# Patient Record
Sex: Male | Born: 1985 | Hispanic: Yes | Marital: Married | Smoking: Never smoker
Health system: Southern US, Community
[De-identification: ages and names within clinical notes are randomized; demographics above are authoritative.]

## PROBLEM LIST (undated history)

## (undated) DIAGNOSIS — I10 Essential (primary) hypertension: Secondary | ICD-10-CM

## (undated) DIAGNOSIS — K219 Gastro-esophageal reflux disease without esophagitis: Secondary | ICD-10-CM

## (undated) HISTORY — DX: Gastro-esophageal reflux disease without esophagitis: K21.9

## (undated) HISTORY — DX: Essential (primary) hypertension: I10

---

## 2009-07-24 ENCOUNTER — Emergency Department (HOSPITAL_COMMUNITY): Admission: EM | Admit: 2009-07-24 | Discharge: 2009-07-24 | Payer: Self-pay | Admitting: Emergency Medicine

## 2009-09-29 ENCOUNTER — Ambulatory Visit: Payer: Self-pay | Admitting: Urgent Care

## 2009-09-29 DIAGNOSIS — K921 Melena: Secondary | ICD-10-CM

## 2009-09-29 DIAGNOSIS — R109 Unspecified abdominal pain: Secondary | ICD-10-CM

## 2009-09-29 DIAGNOSIS — K219 Gastro-esophageal reflux disease without esophagitis: Secondary | ICD-10-CM | POA: Insufficient documentation

## 2009-10-04 ENCOUNTER — Encounter: Payer: Self-pay | Admitting: Gastroenterology

## 2010-10-11 NOTE — Letter (Signed)
Summary: TCS/EGD ORDER  TCS/EGD ORDER   Imported By: Ave Filter 10/04/2009 12:51:57  _____________________________________________________________________  External Attachment:    Type:   Image     Comment:   External Document

## 2010-10-11 NOTE — Assessment & Plan Note (Signed)
Summary: abd pain,ss   Visit Type:  Initial Consult Referring Provider:  Laverle Hobby Primary Care Provider:  Laverle Hobby  Chief Complaint:  abd pain/ blood in stool.  History of Present Illness: 25 y/o hispanic male w/ "muscle strain" lower abd Oct 2010.  Was seen in ER and found to have hematuria.  Saw urologist for hematuria, next urine clear, no work-up needed.  Started robaxin for muscle strain, was checked by Dr Wende Crease and c/o in blood in stool.  Pt c/o pressure anus x 2 months.  Noticed red blood in small amts in toilet & on paper.  c/o constipation with BM  once daily.  Normally 3 formed BMs daily.  Denies N/V/  c/o RUQ pain, worse post-prandially.  On prilosec x 6 wks, helps some.  Denies dysphagia/odynophagia.  Denies wt loss.  Denies fever/chills. c/o bloating w/ nervousness.  Was taking IBU 200mg  daily x 2weeks.  Last workout 3 months ago.  CT A/P (urogram) non-contrast except bulging disk protrusion L5-S1.  Current Problems (verified): 1)  Abdominal Pain  (ICD-789.00) 2)  Hematochezia  (ICD-578.1) 3)  Gerd  (ICD-530.81)  Current Medications (verified): 1)  Prilosec .... Take 1 Tablet By Mouth Once A Day 2)  Tylenol .... As Needed  Allergies (verified): No Known Drug Allergies  Past History:  Past Medical History: GERD  Past Surgical History: Unremarkable  Family History: No known family history of colorectal carcinoma, IBD, liver or chronic GI problems. 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  Alcohol Use - yes, couple time per month Daily Caffeine Use Illicit Drug Use - no Patient gets regular exercise. Smoking Status:  never Drug Use:  no Does Patient Exercise:  yes  Review of Systems      See HPI General:  Denies fever, chills, sweats, anorexia, fatigue, weakness, malaise, weight loss, and sleep disorder. CV:  Denies chest  pains, angina, palpitations, syncope, dyspnea on exertion, orthopnea, PND, peripheral edema, and claudication. Resp:  Denies dyspnea at rest, dyspnea with exercise, cough, sputum, wheezing, coughing up blood, and pleurisy. GU:  Denies urinary burning, blood in urine, urinary frequency, urinary hesitancy, nocturnal urination, and urinary incontinence. Derm:  Denies rash, itching, dry skin, hives, moles, warts, and unhealing ulcers. Psych:  Denies depression, anxiety, memory loss, suicidal ideation, hallucinations, paranoia, phobia, and confusion. Heme:  Denies bruising, bleeding, and enlarged lymph nodes.  Vital Signs:  Patient profile:   25 year old male Height:      66 inches Weight:      203 pounds BMI:     32.88 Temp:     98.3 degrees F oral Pulse rate:   80 / minute BP sitting:   142 / 82  (left arm) Cuff size:   regular  Vitals Entered By: Cloria Spring LPN (September 29, 2009 11:18 AM)  Physical Exam  General:  Well developed, well nourished, no acute distress. Head:  Normocephalic and atraumatic. Eyes:  Sclera clear, no icterus. Ears:  Normal auditory acuity. Nose:  No deformity, discharge,  or lesions. Mouth:  No deformity or lesions, dentition normal. Neck:  Supple; no masses or thyromegaly. Lungs:  Clear throughout to auscultation. Heart:  Regular rate and rhythm; no murmurs, rubs,  or bruits. Abdomen:  Soft, nontender and nondistended. No masses, hepatosplenomegaly or hernias noted. Normal bowel sounds.without guarding and without rebound.   Msk:  Symmetrical with no gross deformities. Normal  posture. Pulses:  Normal pulses noted. Extremities:  No clubbing, cyanosis, edema or deformities noted. Neurologic:  Alert and  oriented x4;  grossly normal neurologically. Skin:  Intact without significant lesions or rashes. Cervical Nodes:  No significant cervical adenopathy. Psych:  Alert and cooperative. Normal mood and affect.  Impression & Recommendations:  Problem # 1:   ABDOMINAL PAIN (ICD-789.00) 25 y/o hispanic male w/ 3 month hx of abd pain that started in lower abd and presented like "muscle strain" and has now migrated to RUQ. Not resolved by rest and avoiding exercise.  He  has typical GERD symptoms not controlled by PPI, as well as hematochezia.  Differentials include PUD, IBS with benign ano-rectal bleeding, gallbladder disease, less likely IBD at this point.  EGD to be performed by Dr. Jonette Eva in the near future.  I have discussed risks and benefits which include, but are not limited to, bleeding, infection, perforation, or medication reaction.  The patient agrees with this plan and consent will be obtained.  Orders: Consultation Level III (16109)  Problem # 2:  HEMATOCHEZIA (ICD-578.1) See#1  Problem # 3:  GERD (ICD-530.81) See #1     Appended Document: abd pain,ss Pt needs TCS for rectal bleeding as well.  Appended Document: abd pain,ss TCS scheduled as well.  Risks/benefits explained at OV.  Pt agreed with plan.  Appended Document: abd pain,ss PT NS for procedures

## 2010-12-14 LAB — URINALYSIS, ROUTINE W REFLEX MICROSCOPIC
Bilirubin Urine: NEGATIVE
Glucose, UA: NEGATIVE mg/dL
Ketones, ur: NEGATIVE mg/dL
Leukocytes, UA: NEGATIVE
Nitrite: NEGATIVE
Protein, ur: NEGATIVE mg/dL
Specific Gravity, Urine: 1.025 (ref 1.005–1.030)
Urobilinogen, UA: 0.2 mg/dL (ref 0.0–1.0)
pH: 6.5 (ref 5.0–8.0)

## 2010-12-14 LAB — URINE MICROSCOPIC-ADD ON

## 2011-01-25 IMAGING — CT CT ABDOMEN W/O CM
2 of 4 series · 16 of 46 positions shown, 18 images · non-contrast
Comparison: None.

CT ABDOMEN

CLINICAL DATA: Left-sided abdomen pain and dizziness.

CT ABDOMEN AND PELVIS WITHOUT CONTRAST (CT UROGRAM)
TECHNIQUE: Multidetector CT imaging was performed through the
abdomen and pelvis to include the urinary tract.

[Series 2: standard/full over (age)lbs 5.0 · axial · 0.64mm/px · z∈[+502,+942]mm · 13 of 96 slices shown, 15 images]
[im 4/96  soft-tissue]
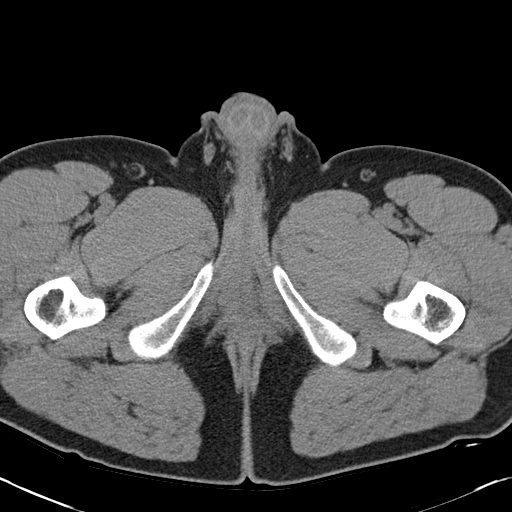
[im 4/96  bone]
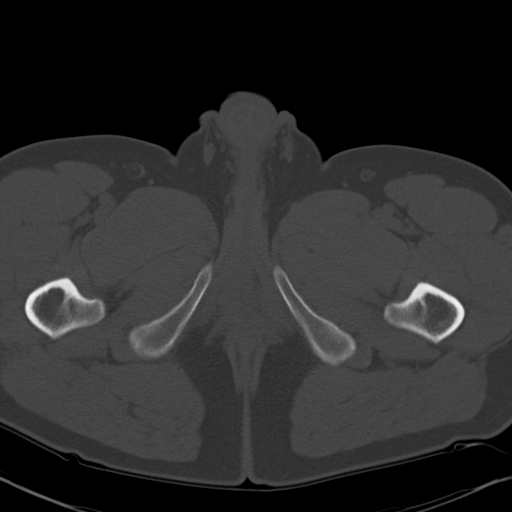
[im 12/96  soft-tissue]
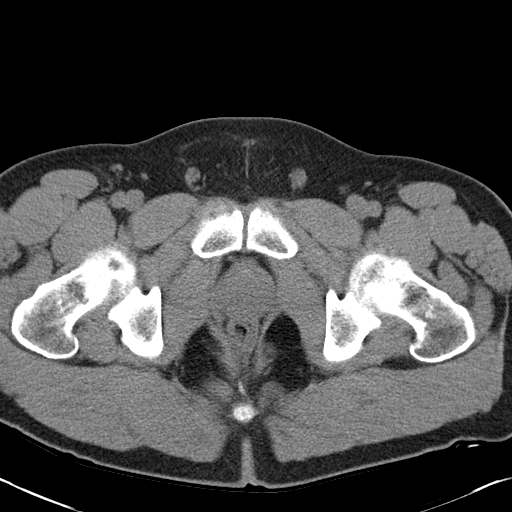
[im 20/96  soft-tissue]
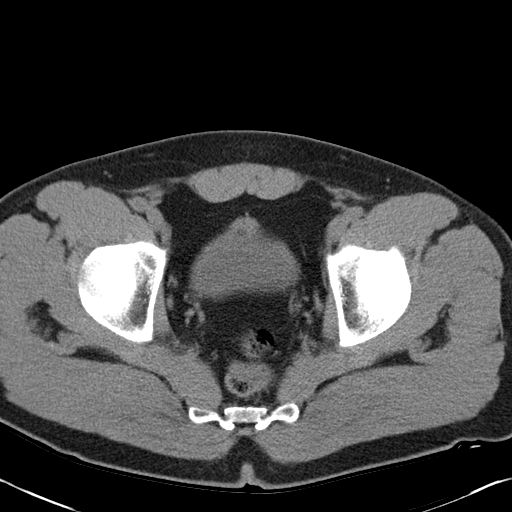
[im 27/96  soft-tissue]
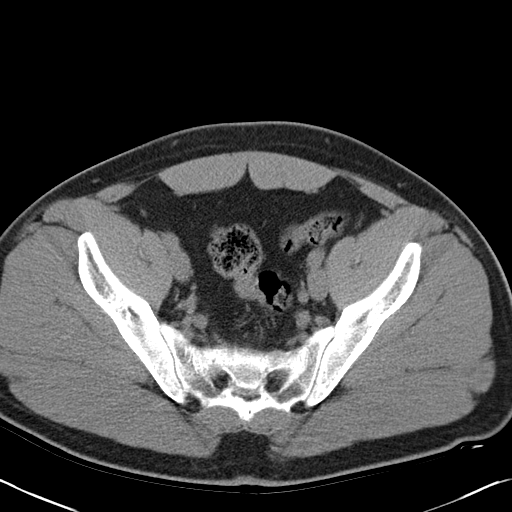
[im 35/96  soft-tissue]
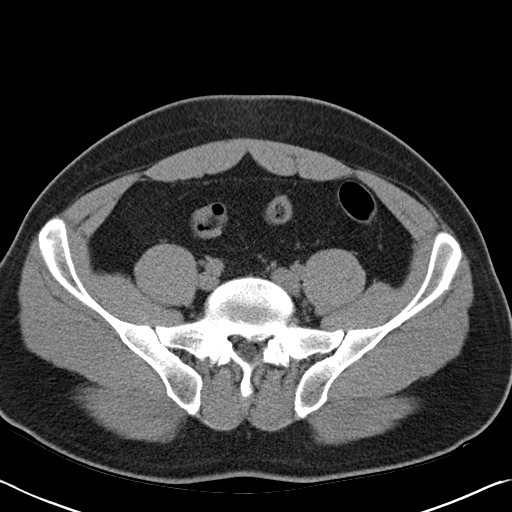
[im 42/96  soft-tissue]
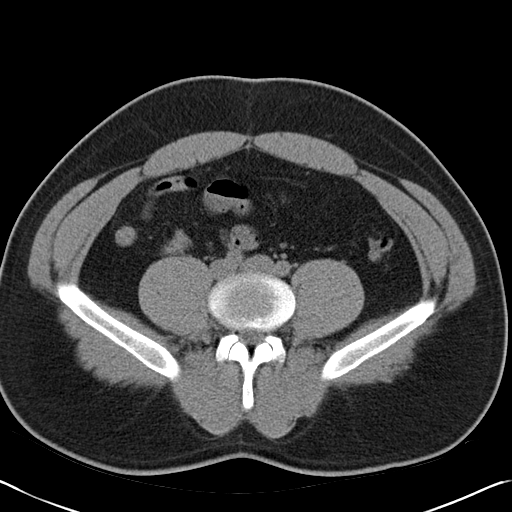
[im 50/96  soft-tissue]
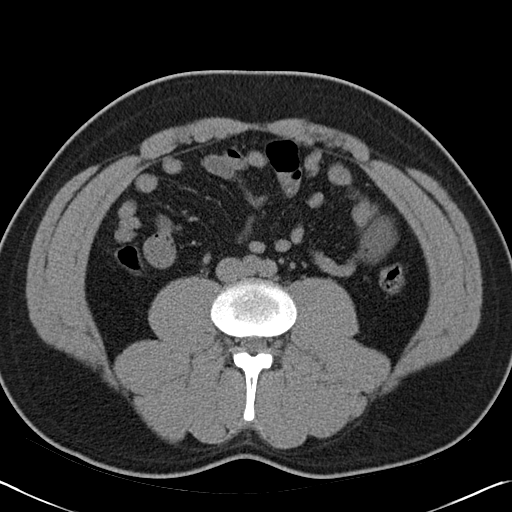
[im 54/96  soft-tissue]
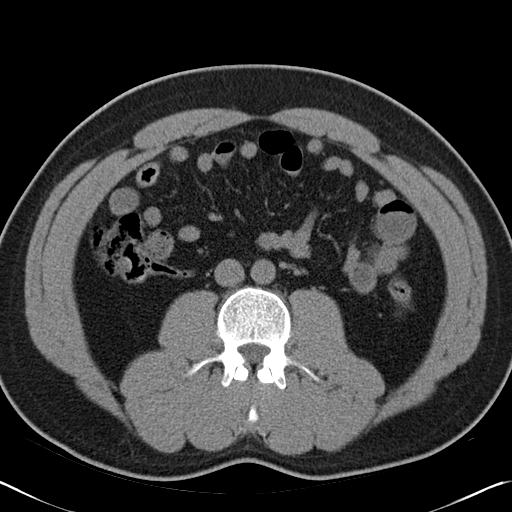
[im 61/96  soft-tissue]
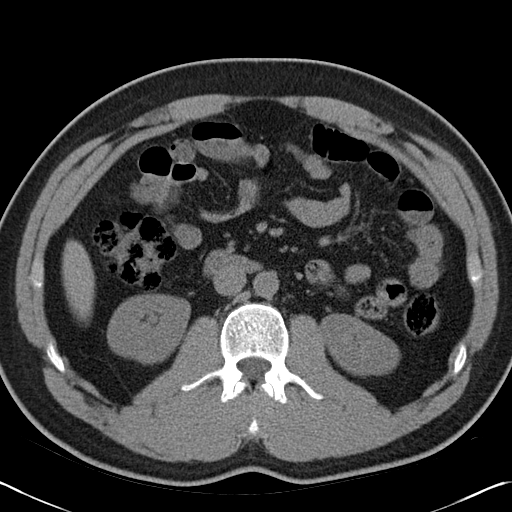
[im 61/96  bone]
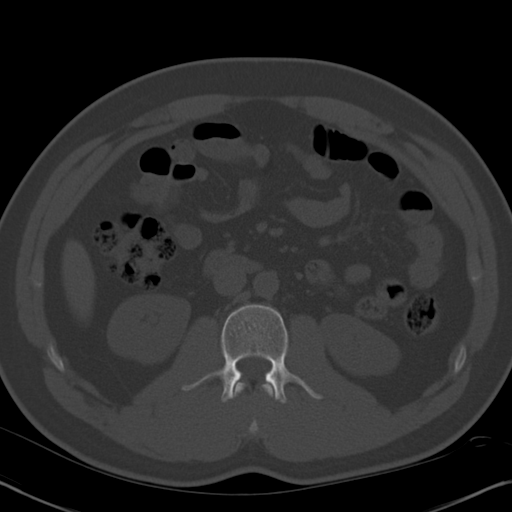
[im 69/96  soft-tissue]
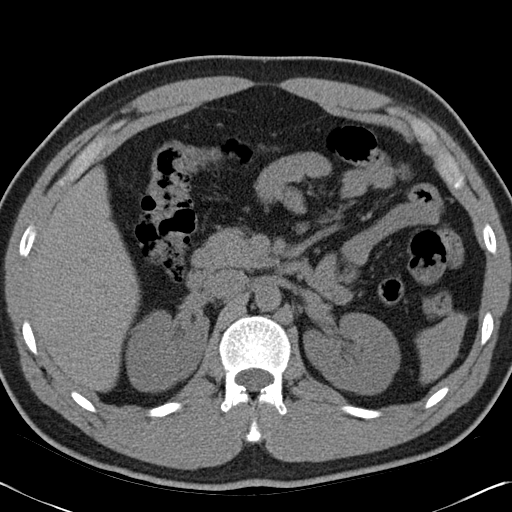
[im 77/96  soft-tissue]
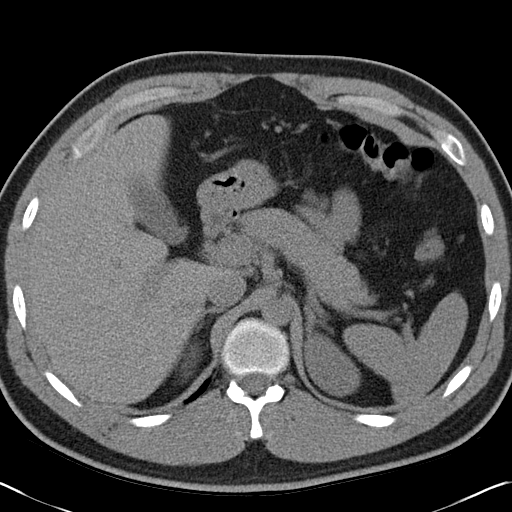
[im 84/96  soft-tissue]
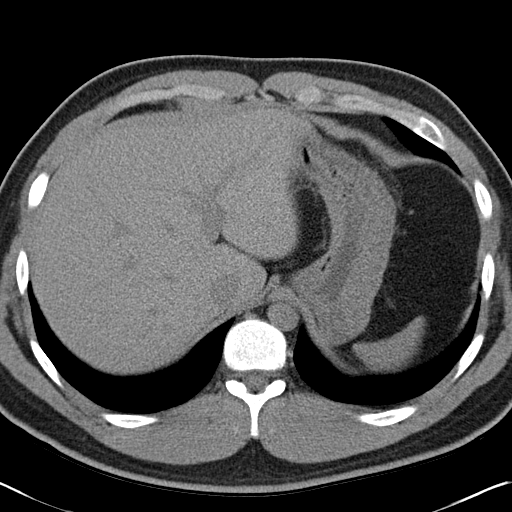
[im 92/96  soft-tissue]
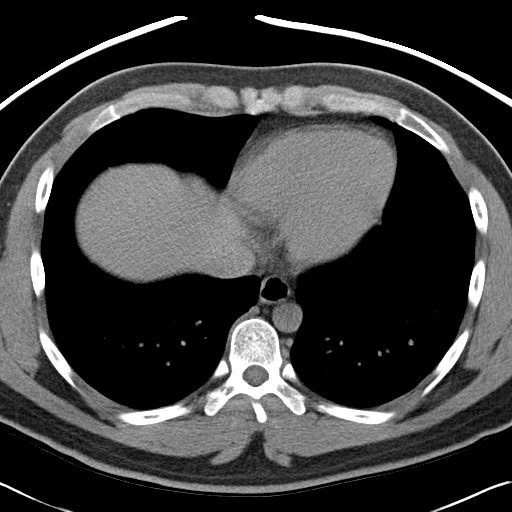

[Series 4: mpr coronal · coronal · 0.68mm/px · 3 of 82 slices shown]
[im 28/82  soft-tissue]
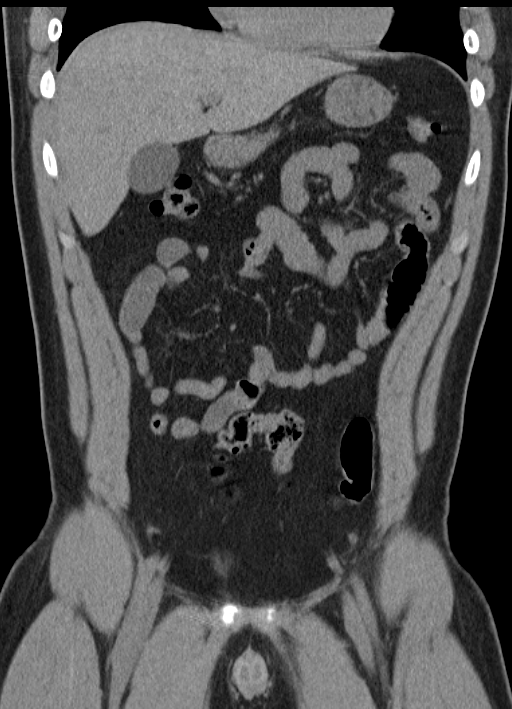
[im 37/82  soft-tissue]
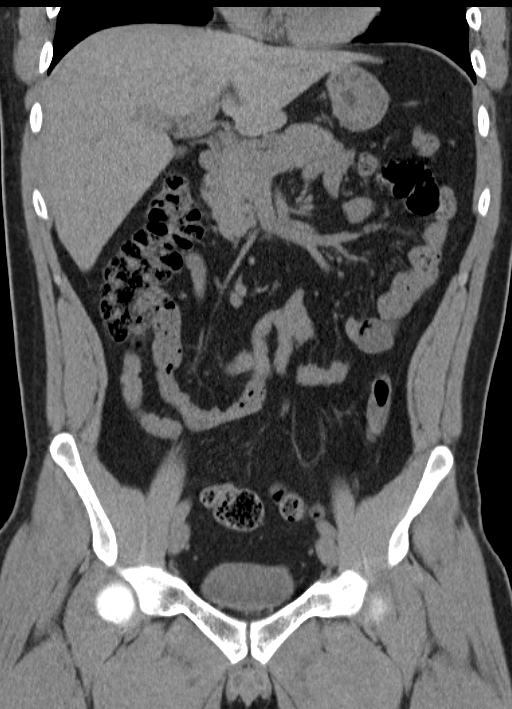
[im 46/82  soft-tissue]
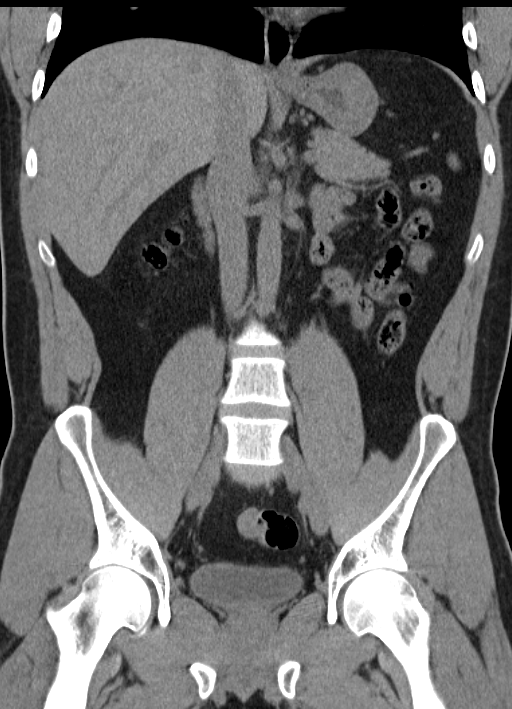

[16 of 46 positions shown; findings below may reference images not displayed]

FINDINGS: No intrarenal or proximal ureteral calculi on either
side. No evidence of hydronephrosis or other secondary signs of
upper urinary tract obstruction. Within limits of unenhanced
technique, normal appearing kidneys.

Again within limits of unenhanced technique, remaining visualized
upper abdomen unremarkable. Visualized extreme lung bases clear.
IMPRESSION: Normal unenhanced CT of the abdomen.

CT PELVIS
FINDINGS: No distal ureteral calculi on either side. Appendix
identified and normal. Prostate gland and seminal vesicles normal
for age. Visualized colon and small bowel unremarkable. No free
fluid.

Incidental small disc protrusion L5-S1.
IMPRESSION: Normal unenhanced CT pelvis.

## 2018-01-10 DIAGNOSIS — S61039A Puncture wound without foreign body of unspecified thumb without damage to nail, initial encounter: Secondary | ICD-10-CM | POA: Insufficient documentation

## 2021-06-19 ENCOUNTER — Ambulatory Visit (INDEPENDENT_AMBULATORY_CARE_PROVIDER_SITE_OTHER): Payer: Self-pay

## 2021-06-19 ENCOUNTER — Ambulatory Visit: Payer: Self-pay

## 2021-06-19 ENCOUNTER — Ambulatory Visit
Admission: EM | Admit: 2021-06-19 | Discharge: 2021-06-19 | Disposition: A | Discharge status: Home / Self Care | Payer: Self-pay | Attending: Family Medicine | Admitting: Family Medicine

## 2021-06-19 ENCOUNTER — Other Ambulatory Visit: Payer: Self-pay

## 2021-06-19 ENCOUNTER — Encounter: Payer: Self-pay | Admitting: Emergency Medicine

## 2021-06-19 ENCOUNTER — Ambulatory Visit: Admission: EM | Admit: 2021-06-19 | Discharge status: Absent without leave | Payer: Self-pay

## 2021-06-19 DIAGNOSIS — M25572 Pain in left ankle and joints of left foot: Secondary | ICD-10-CM

## 2021-06-19 DIAGNOSIS — Y9364 Activity, baseball: Secondary | ICD-10-CM

## 2021-06-19 MED ORDER — NAPROXEN 500 MG PO TABS: 500.0000 mg | ORAL_TABLET | Freq: Two times a day (BID) | ORAL | 0 refills | Status: DC | PRN

## 2021-06-19 NOTE — ED Triage Notes (Signed)
Slid into base and hurt left ankle today.

## 2021-06-19 NOTE — ED Provider Notes (Signed)
[facility]    CSN: [id number] Arrival date & time: 06/19/21  1513      History   Chief Complaint Chief Complaint  Patient presents with   Ankle Pain    left    HPI Matthew Mitchell is a 35 y.o. male.   Patient presenting today with left anterior and lateral ankle pain, decreased range of motion after sliding into first base during a baseball game today.  The area is swollen but not discolored and no numbness, tingling, weakness, skin injury.  Not trying anything over-the-counter thus far, came straight here after it happened.  No past history of orthopedic injury to the ankle.   History reviewed. No pertinent past medical history.  Patient Active Problem List   Diagnosis Date Noted   GERD 09/29/2009   HEMATOCHEZIA 09/29/2009   ABDOMINAL PAIN 09/29/2009    History reviewed. No pertinent surgical history.     Home Medications    Prior to Admission medications   Medication Sig Start Date End Date Taking? Authorizing Provider  naproxen (NAPROSYN) 500 MG tablet Take 1 tablet (500 mg total) by mouth 2 (two) times daily as needed. 06/19/21  Yes Particia Nearing, PA-C    Family History No family history on file.  Social History Social History   Tobacco Use   Smoking status: Never   Smokeless tobacco: Never  Vaping Use   Vaping Use: Never used  Substance Use Topics   Alcohol use: Yes    Alcohol/week: 1.0 standard drink    Types: 1 Standard drinks or equivalent per week     Allergies   Patient has no known allergies.   Review of Systems Review of Systems Per HPI  Physical Exam Triage Vital Signs ED Triage Vitals [06/19/21 1547]  Enc Vitals Group     BP (!) 129/96     Pulse Rate (!) 101     Resp 18     Temp 98.8 F (37.1 C)     Temp Source Oral     SpO2 99 %     Weight 180 lb (81.6 kg)     Height      Head Circumference      Peak Flow      Pain Score 9     Pain Loc      Pain Edu?      Excl. in GC?    No data  found.  Updated Vital Signs BP (!) 129/96 (BP Location: Right Arm)   Pulse (!) 101   Temp 98.8 F (37.1 C) (Oral)   Resp 18   Wt 180 lb (81.6 kg)   SpO2 99%   Visual Acuity Right Eye Distance:   Left Eye Distance:   Bilateral Distance:    Right Eye Near:   Left Eye Near:    Bilateral Near:     Physical Exam Vitals and nursing note reviewed.  Constitutional:      Appearance: Normal appearance.  HENT:     Head: Atraumatic.     Mouth/Throat:     Mouth: Mucous membranes are moist.  Eyes:     Extraocular Movements: Extraocular movements intact.     Conjunctiva/sclera: Conjunctivae normal.  Cardiovascular:     Rate and Rhythm: Normal rate and regular rhythm.  Pulmonary:     Effort: Pulmonary effort is normal.     Breath sounds: Normal breath sounds.  Musculoskeletal:     Cervical back: Normal range of motion and neck supple.  Comments: Currently in wheelchair due to ankle pain, ambulatory at baseline.  Left ankle range of motion exam limited by significant pain but appears to have at least some motion in all directions.  Anterior and lateral left ankle swelling, significant tenderness to palpation particularly anteriorly.  No laxity on exam  Skin:    General: Skin is warm and dry.     Findings: No erythema, lesion or rash.  Neurological:     General: No focal deficit present.     Mental Status: He is oriented to person, place, and time.     Comments: Left lower extremity neurovascularly intact  Psychiatric:        Mood and Affect: Mood normal.        Thought Content: Thought content normal.        Judgment: Judgment normal.     UC Treatments / Results  Labs (all labs ordered are listed, but only abnormal results are displayed) Labs Reviewed - No data to display  EKG   Radiology DG Ankle Complete Left  Result Date: 06/19/2021 CLINICAL DATA:  Baseball injury, left ankle pain EXAM: LEFT ANKLE COMPLETE - 3+ VIEW COMPARISON:  None. FINDINGS: Frontal, oblique,  and lateral views of the left ankle are obtained. No evidence of acute fracture. There is slight widening of the ankle mortise posteriorly and laterally, which may reflect underlying ligamentous injury or laxity. No dislocation. Prominent anterior and lateral soft tissue swelling. IMPRESSION: 1. No acute displaced fracture. 2. Slight widening of the ankle mortise laterally and posteriorly which may reflect ligamentous injury or laxity. 3. Anterolateral soft tissue swelling. Electronically Signed   By: Sharlet Salina M.D.   On: 06/19/2021 15:29    Procedures Procedures (including critical care time)  Medications Ordered in UC Medications - No data to display  Initial Impression / Assessment and Plan / UC Course  I have reviewed the triage vital signs and the nursing notes.  Pertinent labs & imaging results that were available during my care of the patient were reviewed by me and considered in my medical decision making (see chart for details).     No evidence of fracture on x-ray today but x-ray report showing possible evidence of ligament injury.  Given his pain and this finding, will place in a cam boot to help support ambulation and follow-up with orthopedics for recheck and possible soft tissue imaging if necessary.  Naproxen, RICE protocol reviewed.  Final Clinical Impressions(s) / UC Diagnoses   Final diagnoses:  Acute left ankle pain   Discharge Instructions   None    ED Prescriptions     Medication Sig Dispense Auth. Provider   naproxen (NAPROSYN) 500 MG tablet Take 1 tablet (500 mg total) by mouth 2 (two) times daily as needed. 30 tablet Particia Nearing, [place]      PDMP not reviewed this encounter.   Particia Nearing, [place] 06/19/21 [phone]

## 2021-06-24 DIAGNOSIS — S93409A Sprain of unspecified ligament of unspecified ankle, initial encounter: Secondary | ICD-10-CM | POA: Insufficient documentation

## 2021-08-10 ENCOUNTER — Ambulatory Visit (INDEPENDENT_AMBULATORY_CARE_PROVIDER_SITE_OTHER): Payer: No Typology Code available for payment source | Admitting: Internal Medicine

## 2021-08-10 ENCOUNTER — Other Ambulatory Visit: Payer: Self-pay

## 2021-08-10 ENCOUNTER — Ambulatory Visit: Payer: Self-pay | Admitting: Internal Medicine

## 2021-08-10 ENCOUNTER — Encounter: Payer: Self-pay | Admitting: Internal Medicine

## 2021-08-10 VITALS — BP 136/86 | HR 94 | Resp 18 | Ht 66.0 in | Wt 196.1 lb

## 2021-08-10 DIAGNOSIS — K219 Gastro-esophageal reflux disease without esophagitis: Secondary | ICD-10-CM | POA: Diagnosis not present

## 2021-08-10 DIAGNOSIS — Z0001 Encounter for general adult medical examination with abnormal findings: Secondary | ICD-10-CM | POA: Diagnosis not present

## 2021-08-10 DIAGNOSIS — Z114 Encounter for screening for human immunodeficiency virus [HIV]: Secondary | ICD-10-CM

## 2021-08-10 DIAGNOSIS — Z1159 Encounter for screening for other viral diseases: Secondary | ICD-10-CM | POA: Diagnosis not present

## 2021-08-10 DIAGNOSIS — S93409A Sprain of unspecified ligament of unspecified ankle, initial encounter: Secondary | ICD-10-CM

## 2021-08-10 DIAGNOSIS — Z2821 Immunization not carried out because of patient refusal: Secondary | ICD-10-CM

## 2021-08-10 MED ORDER — OMEPRAZOLE 20 MG PO CPDR: 20.0000 mg | DELAYED_RELEASE_CAPSULE | Freq: Every day | ORAL | 5 refills | Status: DC

## 2021-08-10 NOTE — Assessment & Plan Note (Signed)
Chronic epigastric pain likely due to GERD/gastritis Started Omeprazole 20 mg QD Advised to avoid hot and spicy food

## 2021-08-10 NOTE — Progress Notes (Signed)
New Patient Office Visit  Subjective:  Patient ID: Matthew Mitchell, male    DOB: 1986-01-03  Age: 35 y.o. MRN: [id number]  CC:  Chief Complaint  Patient presents with   New Patient (Initial Visit)    New patient hasnt see pcp in awhile     HPI Matthew Mitchell is a 36 y.o. male with no significant past medical history presents for establishing care.  His BP was elevated initially, but improved later during the office visit.  He denies any headache, dizziness, chest pain, dyspnea or palpitations.  He complains of chronic, intermittent epigastric pain.  He denies any nausea, vomiting, dysphagia or odynophagia.  He has tried Tums which improves his symptoms.  He denies any diarrhea, melena or hematochezia currently.  He recently had sprain of left ankle and has been to ER and orthopedic surgery.  He was given an ankle brace and has been feeling better now.  Denies any numbness, tingling or weakness of the left LE.  He has not had COVID vaccine yet.  He refused flu vaccine today.    History reviewed. No pertinent past medical history.  History reviewed. No pertinent surgical history.  History reviewed. No pertinent family history.  Social History   Socioeconomic History   Marital status: Married    Spouse name: Not on file   Number of children: Not on file   Years of education: Not on file   Highest education level: Not on file  Occupational History   Not on file  Tobacco Use   Smoking status: Never   Smokeless tobacco: Never  Vaping Use   Vaping Use: Never used  Substance and Sexual Activity   Alcohol use: Yes    Alcohol/week: 1.0 standard drink    Types: 1 Standard drinks or equivalent per week   Drug use: Not on file   Sexual activity: Yes  Other Topics Concern   Not on file  Social History Narrative   Not on file   Social Determinants of Health   Financial Resource Strain: Not on file  Food Insecurity: Not on file  Transportation Needs: Not on file   Physical Activity: Not on file  Stress: Not on file  Social Connections: Not on file  Intimate Partner Violence: Not on file    ROS Review of Systems  Constitutional:  Negative for chills and fever.  HENT:  Negative for congestion and sore throat.   Eyes:  Negative for pain and discharge.  Respiratory:  Negative for cough and shortness of breath.   Cardiovascular:  Negative for chest pain and palpitations.  Gastrointestinal:  Positive for abdominal pain (Mild, epigastric). Negative for diarrhea, nausea and vomiting.  Endocrine: Negative for polydipsia and polyuria.  Genitourinary:  Negative for dysuria and hematuria.  Musculoskeletal:  Negative for neck pain and neck stiffness.  Skin:  Negative for rash.  Neurological:  Negative for dizziness, weakness, numbness and headaches.  Psychiatric/Behavioral:  Negative for agitation and behavioral problems.    Objective:   Today's Vitals: BP 136/86 (BP Location: Right Arm)   Pulse 94   Resp 18   Ht 5' 6"  (1.676 m)   Wt 196 lb 1.3 oz (88.9 kg)   SpO2 98%   BMI 31.65 kg/m   Physical Exam Vitals reviewed.  Constitutional:      General: He is not in acute distress.    Appearance: He is not diaphoretic.  HENT:     Head: Normocephalic and atraumatic.     Nose: Nose  normal.     Mouth/Throat:     Mouth: Mucous membranes are moist.  Eyes:     General: No scleral icterus.    Extraocular Movements: Extraocular movements intact.  Cardiovascular:     Rate and Rhythm: Normal rate and regular rhythm.     Pulses: Normal pulses.     Heart sounds: Normal heart sounds. No murmur heard. Pulmonary:     Breath sounds: Normal breath sounds. No wheezing or rales.  Abdominal:     Palpations: Abdomen is soft.     Tenderness: There is no abdominal tenderness.  Musculoskeletal:     Cervical back: Neck supple. No tenderness.     Right lower leg: No edema.     Left lower leg: No edema.  Skin:    General: Skin is warm.     Findings: No rash.   Neurological:     General: No focal deficit present.     Mental Status: He is alert and oriented to person, place, and time.     Sensory: No sensory deficit.     Motor: No weakness.  Psychiatric:        Mood and Affect: Mood normal.        Behavior: Behavior normal.    Assessment & Plan:   Problem List Items Addressed This Visit       Digestive   GERD    Chronic epigastric pain likely due to GERD/gastritis Started Omeprazole 20 mg QD Advised to avoid hot and spicy food      Relevant Medications   omeprazole (PRILOSEC) 20 MG capsule     Musculoskeletal and Integument   Sprain of lateral ligament of ankle joint    Had recent ER visit and orthopedic surgery visit Was given a splint, now better Heating pad as needed Tylenol as needed        Other   Encounter for general adult medical examination with abnormal findings - Primary    Physical exam as documented. Fasting blood test within a week. Diet modification and moderate exercise advised.      Relevant Orders   Hemoglobin A1c   CMP14+EGFR   CBC with Differential/Platelet   Lipid Profile   Other Visit Diagnoses     Vitamin D deficiency       Need for hepatitis C screening test       Relevant Orders   Hepatitis C Antibody   Encounter for screening for HIV       Relevant Orders   HIV antibody (with reflex)   Refused influenza vaccine           Outpatient Encounter Medications as of 08/10/2021  Medication Sig   omeprazole (PRILOSEC) 20 MG capsule Take 1 capsule (20 mg total) by mouth daily.   [DISCONTINUED] naproxen (NAPROSYN) 500 MG tablet Take 1 tablet (500 mg total) by mouth 2 (two) times daily as needed. (Patient not taking: Reported on 08/10/2021)   No facility-administered encounter medications on file as of 08/10/2021.    Follow-up: Return in about 1 year (around 08/10/2022), or if symptoms worsen or fail to improve, for Annual physical.   Matthew Spar, MD

## 2021-08-10 NOTE — Patient Instructions (Addendum)
Please take Omeprazole for acid reflux. Avoid hot and spicy food.  Please follow low salt diet and perform moderate exercise/walking at least 150 mins/week.  Please get fasting blood tests done within a week.

## 2021-08-10 NOTE — Assessment & Plan Note (Signed)
Physical exam as documented. Fasting blood test within a week. Diet modification and moderate exercise advised.

## 2021-08-10 NOTE — Assessment & Plan Note (Signed)
Had recent ER visit and orthopedic surgery visit Was given a splint, now better Heating pad as needed Tylenol as needed

## 2021-08-18 LAB — CBC WITH DIFFERENTIAL/PLATELET
Basophils Absolute: 0 10*3/uL (ref 0.0–0.2)
Basos: 0 %
EOS (ABSOLUTE): 0.1 10*3/uL (ref 0.0–0.4)
Eos: 2 %
Hematocrit: 45.5 % (ref 37.5–51.0)
Hemoglobin: 15.5 g/dL (ref 13.0–17.7)
Immature Grans (Abs): 0 10*3/uL (ref 0.0–0.1)
Immature Granulocytes: 0 %
Lymphocytes Absolute: 2.5 10*3/uL (ref 0.7–3.1)
Lymphs: 37 %
MCH: 29.8 pg (ref 26.6–33.0)
MCHC: 34.1 g/dL (ref 31.5–35.7)
MCV: 87 fL (ref 79–97)
Monocytes Absolute: 0.4 10*3/uL (ref 0.1–0.9)
Monocytes: 5 %
Neutrophils Absolute: 3.8 10*3/uL (ref 1.4–7.0)
Neutrophils: 56 %
Platelets: 242 10*3/uL (ref 150–450)
RBC: 5.21 x10E6/uL (ref 4.14–5.80)
RDW: 13.1 % (ref 11.6–15.4)
WBC: 6.8 10*3/uL (ref 3.4–10.8)

## 2021-08-18 LAB — CMP14+EGFR
ALT: 26 IU/L (ref 0–44)
AST: 17 IU/L (ref 0–40)
Albumin/Globulin Ratio: 2.7 — ABNORMAL HIGH (ref 1.2–2.2)
Albumin: 5.1 g/dL — ABNORMAL HIGH (ref 4.0–5.0)
Alkaline Phosphatase: 83 IU/L (ref 44–121)
BUN/Creatinine Ratio: 16 (ref 9–20)
BUN: 15 mg/dL (ref 6–20)
Bilirubin Total: 0.4 mg/dL (ref 0.0–1.2)
CO2: 20 mmol/L (ref 20–29)
Calcium: 9.7 mg/dL (ref 8.7–10.2)
Chloride: 102 mmol/L (ref 96–106)
Creatinine, Ser: 0.91 mg/dL (ref 0.76–1.27)
Globulin, Total: 1.9 g/dL (ref 1.5–4.5)
Glucose: 85 mg/dL (ref 70–99)
Potassium: 4.3 mmol/L (ref 3.5–5.2)
Sodium: 140 mmol/L (ref 134–144)
Total Protein: 7 g/dL (ref 6.0–8.5)
eGFR: 113 mL/min/{1.73_m2} (ref >59)

## 2021-08-18 LAB — LIPID PANEL
Chol/HDL Ratio: 5 ratio (ref 0.0–5.0)
Cholesterol, Total: 241 mg/dL — ABNORMAL HIGH (ref 100–199)
HDL: 48 mg/dL (ref >39)
LDL Chol Calc (NIH): 169 mg/dL — ABNORMAL HIGH (ref 0–99)
Triglycerides: 134 mg/dL (ref 0–149)
VLDL Cholesterol Cal: 24 mg/dL (ref 5–40)

## 2021-08-18 LAB — HEPATITIS C ANTIBODY: Hep C Virus Ab: <0.1 s/co ratio (ref 0.0–0.9)

## 2021-08-18 LAB — HEMOGLOBIN A1C
Est. average glucose Bld gHb Est-mCnc: 111 mg/dL
Hgb A1c MFr Bld: 5.5 % (ref 4.8–5.6)

## 2021-08-18 LAB — HIV ANTIBODY (ROUTINE TESTING W REFLEX): HIV Screen 4th Generation wRfx: NONREACTIVE

## 2021-09-16 ENCOUNTER — Ambulatory Visit: Payer: Self-pay | Admitting: Internal Medicine

## 2021-12-28 ENCOUNTER — Encounter: Payer: Self-pay | Admitting: Internal Medicine

## 2021-12-28 ENCOUNTER — Ambulatory Visit (INDEPENDENT_AMBULATORY_CARE_PROVIDER_SITE_OTHER): Payer: No Typology Code available for payment source | Admitting: Internal Medicine

## 2021-12-28 VITALS — BP 140/82 | HR 98 | Resp 18 | Ht 66.0 in | Wt 198.0 lb

## 2021-12-28 DIAGNOSIS — Z23 Encounter for immunization: Secondary | ICD-10-CM

## 2021-12-28 DIAGNOSIS — G43719 Chronic migraine without aura, intractable, without status migrainosus: Secondary | ICD-10-CM | POA: Diagnosis not present

## 2021-12-28 MED ORDER — SUMATRIPTAN SUCCINATE 50 MG PO TABS: 50.0000 mg | ORAL_TABLET | ORAL | 1 refills | Status: AC | PRN

## 2021-12-28 NOTE — Patient Instructions (Signed)
Please take Sumatriptan as needed for headache. ? ?Please maintain simple sleep hygiene. ?- Maintain dark and non-noisy environment in the bedroom. ?- Please use the bedroom for sleep and sexual activity only. ?- Do not use electronic devices in the bedroom. ?- Please take dinner at least 2 hours before bedtime. ?- Please avoid caffeinated products in the evening, including coffee, soft drinks. ?- Please try to maintain the regular sleep-wake cycle - Go to bed and wake up at the same time. ?

## 2021-12-28 NOTE — Assessment & Plan Note (Addendum)
Headache quality likely migraine ?Will give a trial of Imitrex ?If persistent headache or frequent use of triptan needed, will start maintenance treatment ?Hears heart sounds in left side ear only, if persistent concern, will get MRI brain ?

## 2021-12-28 NOTE — Progress Notes (Signed)
? ?Acute Office Visit ? ?Subjective:  ? ? Patient ID: Matthew Mitchell, male    DOB: [birth year], 36 y.o.   MRN: [id number] ? ?Chief Complaint  ?Patient presents with  ? Follow-up  ?  Pt still having pain in head it comes on sudden and then feels really tired   ? ? ?HPI ?Patient is in today for c/o headache episodes, which are intermittent, sharp over left frontal, temporal and parietal area, have been about twice in a week, lasts about an hour and is associated with photo and phonophobia and nausea.  He reports that he has more frequent headaches when he has stress at work.  He has cut down his work schedule, which has improved his headache recently as well.  He denies any blurry vision currently, but has slight dizziness upon sudden eye movement. ? ?History reviewed. No pertinent past medical history. ? ?History reviewed. No pertinent surgical history. ? ?History reviewed. No pertinent family history. ? ?Social History  ? ?Socioeconomic History  ? Marital status: Married  ?  Spouse name: Not on file  ? Number of children: Not on file  ? Years of education: Not on file  ? Highest education level: Not on file  ?Occupational History  ? Not on file  ?Tobacco Use  ? Smoking status: Never  ? Smokeless tobacco: Never  ?Vaping Use  ? Vaping Use: Never used  ?Substance and Sexual Activity  ? Alcohol use: Yes  ?  Alcohol/week: 1.0 standard drink  ?  Types: 1 Standard drinks or equivalent per week  ? Drug use: Not on file  ? Sexual activity: Yes  ?Other Topics Concern  ? Not on file  ?Social History Narrative  ? Not on file  ? ?Social Determinants of Health  ? ?Financial Resource Strain: Not on file  ?Food Insecurity: Not on file  ?Transportation Needs: Not on file  ?Physical Activity: Not on file  ?Stress: Not on file  ?Social Connections: Not on file  ?Intimate Partner Violence: Not on file  ? ? ?Outpatient Medications Prior to Visit  ?Medication Sig Dispense Refill  ? omeprazole (PRILOSEC) 20 MG capsule Take 1 capsule (20 mg  total) by mouth daily. 30 capsule 5  ? ?No facility-administered medications prior to visit.  ? ? ?No Known Allergies ? ?Review of Systems  ?Constitutional:  Negative for chills and fever.  ?HENT:  Negative for congestion and sore throat.   ?Eyes:  Negative for pain and discharge.  ?Respiratory:  Negative for cough and shortness of breath.   ?Cardiovascular:  Negative for chest pain and palpitations.  ?Gastrointestinal:  Positive for abdominal pain (Mild, epigastric) and nausea. Negative for diarrhea and vomiting.  ?Endocrine: Negative for polydipsia and polyuria.  ?Genitourinary:  Negative for dysuria and hematuria.  ?Musculoskeletal:  Negative for neck pain and neck stiffness.  ?Skin:  Negative for rash.  ?Neurological:  Positive for headaches. Negative for dizziness, weakness and numbness.  ?Psychiatric/Behavioral:  Negative for agitation and behavioral problems.   ? ?   ?Objective:  ?  ?Physical Exam ?Vitals reviewed.  ?Constitutional:   ?   General: He is not in acute distress. ?   Appearance: He is not diaphoretic.  ?HENT:  ?   Head: Normocephalic and atraumatic.  ?   Nose: Nose normal.  ?   Mouth/Throat:  ?   Mouth: Mucous membranes are moist.  ?Eyes:  ?   General: No scleral icterus. ?   Extraocular Movements: Extraocular movements intact.  ?Cardiovascular:  ?  Rate and Rhythm: Normal rate and regular rhythm.  ?   Pulses: Normal pulses.  ?   Heart sounds: Normal heart sounds. No murmur heard. ?Pulmonary:  ?   Breath sounds: Normal breath sounds. No wheezing or rales.  ?Musculoskeletal:  ?   Cervical back: Neck supple. No tenderness.  ?   Right lower leg: No edema.  ?   Left lower leg: No edema.  ?Skin: ?   General: Skin is warm.  ?   Findings: No rash.  ?Neurological:  ?   General: No focal deficit present.  ?   Mental Status: He is alert and oriented to person, place, and time.  ?   Sensory: No sensory deficit.  ?   Motor: No weakness.  ?Psychiatric:     ?   Mood and Affect: Mood normal.     ?   Behavior:  Behavior normal.  ? ? ?BP 140/82 (BP Location: Right Arm, Patient Position: Sitting, Cuff Size: Normal)   Pulse 98   Resp 18   Ht 5\' 6"  (1.676 m)   Wt 198 lb (89.8 kg)   SpO2 99%   BMI 31.96 kg/m?  ?Wt Readings from Last 3 Encounters:  ?12/28/21 198 lb (89.8 kg)  ?08/10/21 196 lb 1.3 oz (88.9 kg)  ?06/19/21 180 lb (81.6 kg)  ? ? ? ?   ?Assessment & Plan:  ? ?Problem List Items Addressed This Visit   ? ?  ? Cardiovascular and Mediastinum  ? Intractable chronic migraine without aura and without status migrainosus - Primary  ?  Headache quality likely migraine ?Will give a trial of Imitrex ?If persistent headache or frequent use of triptan needed, will start maintenance treatment ?Hears heart sounds in left side ear only, if persistent concern, will get MRI brain ? ?  ?  ? Relevant Medications  ? SUMAtriptan (IMITREX) 50 MG tablet  ? ?Other Visit Diagnoses   ? ? Need for Tdap vaccination      ? Relevant Orders  ? Tdap vaccine greater than or equal to 7yo IM (Completed)  ? ?  ? ? ? ?Meds ordered this encounter  ?Medications  ? SUMAtriptan (IMITREX) 50 MG tablet  ?  Sig: Take 1 tablet (50 mg total) by mouth every 2 (two) hours as needed for migraine. May repeat in 2 hours if headache persists or recurs.  ?  Dispense:  10 tablet  ?  Refill:  1  ? ? ? ?08/19/21, MD ?

## 2022-01-09 ENCOUNTER — Ambulatory Visit: Payer: No Typology Code available for payment source | Admitting: Internal Medicine

## 2022-02-08 ENCOUNTER — Encounter: Payer: Self-pay | Admitting: Internal Medicine

## 2022-02-08 ENCOUNTER — Ambulatory Visit (INDEPENDENT_AMBULATORY_CARE_PROVIDER_SITE_OTHER): Payer: No Typology Code available for payment source | Admitting: Internal Medicine

## 2022-02-08 VITALS — BP 136/86 | HR 90 | Resp 18 | Ht 66.0 in | Wt 197.6 lb

## 2022-02-08 DIAGNOSIS — E559 Vitamin D deficiency, unspecified: Secondary | ICD-10-CM

## 2022-02-08 DIAGNOSIS — G43719 Chronic migraine without aura, intractable, without status migrainosus: Secondary | ICD-10-CM

## 2022-02-08 DIAGNOSIS — R030 Elevated blood-pressure reading, without diagnosis of hypertension: Secondary | ICD-10-CM

## 2022-02-08 DIAGNOSIS — K219 Gastro-esophageal reflux disease without esophagitis: Secondary | ICD-10-CM | POA: Diagnosis not present

## 2022-02-08 DIAGNOSIS — E782 Mixed hyperlipidemia: Secondary | ICD-10-CM

## 2022-02-08 DIAGNOSIS — Z0000 Encounter for general adult medical examination without abnormal findings: Secondary | ICD-10-CM

## 2022-02-08 NOTE — Progress Notes (Signed)
Established Patient Office Visit  Subjective:  Patient ID: Matthew Mitchell, male    DOB: [birth year]  Age: 36 y.o. MRN: [id number]  CC:  Chief Complaint  Patient presents with   Follow-up    6 week follow up pain in head is better     HPI [name] is a 36 y.o. male with past medical history of migraine and GERD who presents for f/u of his chronic medical conditions.  His headache has improved now. He had to take Sumatriptan only once for it. He denies any visual disturbance, dizziness or numbness/tingling of extremities.  He takes omeprazole as needed for epigastric pain.  Denies any dysphagia or odynophagia.  History reviewed. No pertinent past medical history.  History reviewed. No pertinent surgical history.  History reviewed. No pertinent family history.  Social History   Socioeconomic History   Marital status: Married    Spouse name: Not on file   Number of children: Not on file   Years of education: Not on file   Highest education level: Not on file  Occupational History   Not on file  Tobacco Use   Smoking status: Never   Smokeless tobacco: Never  Vaping Use   Vaping Use: Never used  Substance and Sexual Activity   Alcohol use: Yes    Alcohol/week: 1.0 standard drink    Types: 1 Standard drinks or equivalent per week   Drug use: Not on file   Sexual activity: Yes  Other Topics Concern   Not on file  Social History Narrative   Not on file   Social Determinants of Health   Financial Resource Strain: Not on file  Food Insecurity: Not on file  Transportation Needs: Not on file  Physical Activity: Not on file  Stress: Not on file  Social Connections: Not on file  Intimate Partner Violence: Not on file    Outpatient Medications Prior to Visit  Medication Sig Dispense Refill   SUMAtriptan (IMITREX) 50 MG tablet Take 1 tablet (50 mg total) by mouth every 2 (two) hours as needed for migraine. May repeat in 2 hours if headache persists or recurs. 10  tablet 1   omeprazole (PRILOSEC) 20 MG capsule Take 1 capsule (20 mg total) by mouth daily. (Patient not taking: Reported on 02/08/2022) 30 capsule 5   No facility-administered medications prior to visit.    No Known Allergies  ROS Review of Systems  Constitutional:  Negative for chills and fever.  HENT:  Negative for congestion and sore throat.   Eyes:  Negative for pain and discharge.  Respiratory:  Negative for cough and shortness of breath.   Cardiovascular:  Negative for chest pain and palpitations.  Gastrointestinal:  Positive for abdominal pain (Mild, epigastric). Negative for diarrhea and vomiting.  Endocrine: Negative for polydipsia and polyuria.  Genitourinary:  Negative for dysuria and hematuria.  Musculoskeletal:  Negative for neck pain and neck stiffness.  Skin:  Negative for rash.  Neurological:  Positive for headaches. Negative for dizziness, weakness and numbness.  Psychiatric/Behavioral:  Negative for agitation and behavioral problems.      Objective:    Physical Exam Vitals reviewed.  Constitutional:      General: He is not in acute distress.    Appearance: He is not diaphoretic.  HENT:     Head: Normocephalic and atraumatic.     Nose: Nose normal.     Mouth/Throat:     Mouth: Mucous membranes are moist.  Eyes:     General:  No scleral icterus.    Extraocular Movements: Extraocular movements intact.  Cardiovascular:     Rate and Rhythm: Normal rate and regular rhythm.     Pulses: Normal pulses.     Heart sounds: Normal heart sounds. No murmur heard. Pulmonary:     Breath sounds: Normal breath sounds. No wheezing or rales.  Abdominal:     Palpations: Abdomen is soft.     Tenderness: There is no abdominal tenderness.  Musculoskeletal:     Cervical back: Neck supple. No tenderness.     Right lower leg: No edema.     Left lower leg: No edema.  Skin:    General: Skin is warm.     Findings: No rash.  Neurological:     General: No focal deficit  present.     Mental Status: He is alert and oriented to person, place, and time.     Sensory: No sensory deficit.     Motor: No weakness.  Psychiatric:        Mood and Affect: Mood normal.        Behavior: Behavior normal.    BP 136/86 (BP Location: Left Arm, Cuff Size: Normal)   Pulse 90   Resp 18   Ht 5' 6"  (1.676 m)   Wt 197 lb 9.6 oz (89.6 kg)   SpO2 99%   BMI 31.89 kg/m  Wt Readings from Last 3 Encounters:  02/08/22 197 lb 9.6 oz (89.6 kg)  12/28/21 198 lb (89.8 kg)  08/10/21 196 lb 1.3 oz (88.9 kg)    No results found for: TSH Lab Results  Component Value Date   WBC 6.8 08/17/2021   HGB 15.5 08/17/2021   HCT 45.5 08/17/2021   MCV 87 08/17/2021   PLT 242 08/17/2021   Lab Results  Component Value Date   NA 140 08/17/2021   K 4.3 08/17/2021   CO2 20 08/17/2021   GLUCOSE 85 08/17/2021   BUN 15 08/17/2021   CREATININE 0.91 08/17/2021   BILITOT 0.4 08/17/2021   ALKPHOS 83 08/17/2021   AST 17 08/17/2021   ALT 26 08/17/2021   PROT 7.0 08/17/2021   ALBUMIN 5.1 (H) 08/17/2021   CALCIUM 9.7 08/17/2021   EGFR 113 08/17/2021   Lab Results  Component Value Date   CHOL 241 (H) 08/17/2021   Lab Results  Component Value Date   HDL 48 08/17/2021   Lab Results  Component Value Date   LDLCALC 169 (H) 08/17/2021   Lab Results  Component Value Date   TRIG 134 08/17/2021   Lab Results  Component Value Date   CHOLHDL 5.0 08/17/2021   Lab Results  Component Value Date   HGBA1C 5.5 08/17/2021      Assessment & Plan:   Problem List Items Addressed This Visit       Cardiovascular and Mediastinum   Intractable chronic migraine without aura and without status migrainosus - Primary    Headache quality likely migraine Improved with Imitrex, required it only once since the last visit If persistent headache or frequent use of triptan needed, will start maintenance treatment         Digestive   GERD    Chronic epigastric pain likely due to  GERD/gastritis On Omeprazole 20 mg QD PRN Advised to avoid hot and spicy food         Other   Mixed hyperlipidemia    Lipid profile reviewed Advised to follow low cholesterol diet for now  Prehypertension    BP Readings from Last 1 Encounters:  02/08/22 136/86  Well-controlled Advised DASH diet and moderate exercise/walking, at least 150 mins/week      Other Visit Diagnoses     Annual physical exam       Relevant Orders   TSH   Lipid panel   Hemoglobin A1c   CMP14+EGFR   CBC with Differential/Platelet   Vitamin D deficiency       Relevant Orders   VITAMIN D 25 Hydroxy (Vit-D Deficiency, Fractures)       No orders of the defined types were placed in this encounter.   Follow-up: Return in about 6 months (around 08/10/2022) for Annual physical.    Lindell Spar, MD

## 2022-02-08 NOTE — Patient Instructions (Signed)
Please continue to follow low salt diet and ambulate as tolerated.  Please get fasting blood tests done before the next visit.

## 2022-02-08 NOTE — Assessment & Plan Note (Addendum)
Headache quality likely migraine Improved with Imitrex, required it only once since the last visit If persistent headache or frequent use of triptan needed, will start maintenance treatment

## 2022-02-08 NOTE — Assessment & Plan Note (Signed)
Chronic epigastric pain likely due to GERD/gastritis On Omeprazole 20 mg QD PRN Advised to avoid hot and spicy food

## 2022-02-08 NOTE — Assessment & Plan Note (Signed)
Lipid profile reviewed Advised to follow low cholesterol diet for now 

## 2022-02-08 NOTE — Assessment & Plan Note (Signed)
BP Readings from Last 1 Encounters:  02/08/22 136/86   Well-controlled Advised DASH diet and moderate exercise/walking, at least 150 mins/week

## 2022-08-11 ENCOUNTER — Encounter: Payer: No Typology Code available for payment source | Admitting: Internal Medicine

## 2022-08-15 ENCOUNTER — Other Ambulatory Visit: Payer: Self-pay | Admitting: Internal Medicine

## 2022-08-15 DIAGNOSIS — K219 Gastro-esophageal reflux disease without esophagitis: Secondary | ICD-10-CM

## 2022-09-01 ENCOUNTER — Ambulatory Visit
Admission: RE | Admit: 2022-09-01 | Discharge: 2022-09-01 | Disposition: A | Discharge status: Home / Self Care | Payer: Self-pay | Source: Ambulatory Visit

## 2022-09-01 VITALS — BP 141/81 | HR 81 | Temp 98.6°F | Resp 16

## 2022-09-01 DIAGNOSIS — A084 Viral intestinal infection, unspecified: Secondary | ICD-10-CM

## 2022-09-01 MED ORDER — ONDANSETRON 4 MG PO TBDP: 4.0000 mg | ORAL_TABLET | Freq: Three times a day (TID) | ORAL | 0 refills | Status: AC | PRN

## 2022-09-01 NOTE — ED Provider Notes (Signed)
[facility]    CSN: [id number] Arrival date & time: 09/01/22  1225      History   Chief Complaint Chief Complaint  Patient presents with   Diarrhea    Diarrhea and stomach inflammation for 3 days without relief. - Entered by patient   Abdominal Pain    HPI Matthew Mitchell is a 36 y.o. male.   Patient presents today for 3 days of abdominal cramping and diarrhea at nighttime.  Also at nighttime, he feels like his belly gets full of gas and has a lot of acid.  Reports prior to symptoms starting, he ate fish at a [employer].  Reports daughters had similar symptoms earlier this week, however they are now improved and he is not.  Reports 20+ episodes of diarrhea per night.  Describes the stool as pure water and yellow in color.  No blood in the stool.  Also is having some nausea, however no vomiting.  No fevers, sore throat, cough or congestion.  Patient's appetite is decreased because he is scared to eat for fear of throwing up.  Reports he has been drinking plenty of liquids and tolerating them well.  Patient denies antibiotic use in the past month.  Reports symptoms were not getting any better or getting any worse.    History reviewed. No pertinent past medical history.  Patient Active Problem List   Diagnosis Date Noted   Mixed hyperlipidemia 02/08/2022   Prehypertension 02/08/2022   Intractable chronic migraine without aura and without status migrainosus 12/28/2021   Encounter for general adult medical examination with abnormal findings 08/10/2021   Sprain of lateral ligament of ankle joint 06/24/2021   Puncture wound of thumb 01/10/2018   GERD 09/29/2009    History reviewed. No pertinent surgical history.     Home Medications    Prior to Admission medications   Medication Sig Start Date End Date Taking? Authorizing Provider  ondansetron (ZOFRAN-ODT) 4 MG disintegrating tablet Take 1 tablet (4 mg total) by mouth every 8 (eight) hours as needed for  nausea or vomiting. 09/01/22  Yes Valentino Nose, NP  simethicone (MYLICON) 80 MG chewable tablet Chew 80 mg by mouth every 6 (six) hours as needed for flatulence.   Yes [provider]  omeprazole (PRILOSEC) 20 MG capsule TAKE 1 CAPSULE(20 MG) BY MOUTH DAILY 08/15/22   Anabel Halon, MD  SUMAtriptan (IMITREX) 50 MG tablet Take 1 tablet (50 mg total) by mouth every 2 (two) hours as needed for migraine. May repeat in 2 hours if headache persists or recurs. 12/28/21   Anabel Halon, MD    Family History History reviewed. No pertinent family history.  Social History Social History   Tobacco Use   Smoking status: Never   Smokeless tobacco: Never  Vaping Use   Vaping Use: Never used  Substance Use Topics   Alcohol use: Yes    Alcohol/week: 1.0 standard drink of alcohol    Types: 1 Standard drinks or equivalent per week    Comment: Occ   Drug use: Never     Allergies   Patient has no known allergies.   Review of Systems Review of Systems Per HPI  Physical Exam Triage Vital Signs ED Triage Vitals  Enc Vitals Group     BP 09/01/22 1242 (!) 141/81     Pulse Rate 09/01/22 1240 81     Resp 09/01/22 1240 16     Temp 09/01/22 1240 98.6 F (37 C)  Temp Source 09/01/22 1240 Oral     SpO2 09/01/22 1240 98 %     Weight --      Height --      Head Circumference --      Peak Flow --      Pain Score 09/01/22 1240 9     Pain Loc --      Pain Edu? --      Excl. in [place]? --    No data found.  Updated Vital Signs BP (!) 141/81 (BP Location: Right Arm)   Pulse 81   Temp 98.6 F (37 C) (Oral)   Resp 16   SpO2 98%   Visual Acuity Right Eye Distance:   Left Eye Distance:   Bilateral Distance:    Right Eye Near:   Left Eye Near:    Bilateral Near:     Physical Exam Vitals and nursing note reviewed.  Constitutional:      General: He is not in acute distress.    Appearance: Normal appearance. He is not toxic-appearing.  HENT:     Head: Normocephalic  and atraumatic.     Mouth/Throat:     Mouth: Mucous membranes are moist.     Pharynx: Oropharynx is clear. No posterior oropharyngeal erythema.  Cardiovascular:     Rate and Rhythm: Normal rate and regular rhythm.  Pulmonary:     Effort: Pulmonary effort is normal. No respiratory distress.     Breath sounds: Normal breath sounds. No wheezing, rhonchi or rales.  Abdominal:     General: Abdomen is flat. Bowel sounds are normal. There is no distension.     Palpations: Abdomen is soft.     Tenderness: There is no abdominal tenderness. There is no right CVA tenderness, left CVA tenderness, guarding or rebound.  Musculoskeletal:     Cervical back: Normal range of motion.  Lymphadenopathy:     Cervical: No cervical adenopathy.  Skin:    General: Skin is warm and dry.     Capillary Refill: Capillary refill takes less than 2 seconds.     Coloration: Skin is not jaundiced or pale.     Findings: No erythema.  Neurological:     Mental Status: He is alert and oriented to person, place, and time.     Motor: No weakness.     Gait: Gait normal.  Psychiatric:        Behavior: Behavior is cooperative.      UC Treatments / Results  Labs (all labs ordered are listed, but only abnormal results are displayed) Labs Reviewed - No data to display  EKG   Radiology No results found.  Procedures Procedures (including critical care time)  Medications Ordered in UC Medications - No data to display  Initial Impression / Assessment and Plan / UC Course  I have reviewed the triage vital signs and the nursing notes.  Pertinent labs & imaging results that were available during my care of the patient were reviewed by me and considered in my medical decision making (see chart for details).   Patient is well-appearing, normotensive, afebrile, not tachycardic, not tachypneic, oxygenating well on room air.    Viral gastroenteritis Examination and vital signs today are reassuring Suspect most likely  viral gastroenteritis, however given no improvement and description of symptoms, will check GI pathogen panel - patient to return stool sample  Zofran ODT given that he can use every 8 hours as needed to help continue fluids, can also start soft foods Strict  ER precautions discussed with patient  The patient was given the opportunity to ask questions.  All questions answered to their satisfaction.  The patient is in agreement to this plan.    Final Clinical Impressions(s) / UC Diagnoses   Final diagnoses:  Viral gastroenteritis     Discharge Instructions      It sounds like you may have a stomach bug.  If the diarrhea returns tonight, please return a stool sample to [place] tomorrow.  Continue staying hydrating with plenty of fluids and sugar free electrolyte solutions.  You can take the Zofran under your tongue every 8 hours as needed for nausea/vomiting.  Recommend eating easy to digest foods like pudding, applesauce, mashed potatoes, etc. For the next couple of days.  If your symptoms worsen or persist longer than 5 days without improvement, follow up in ER.     ED Prescriptions     Medication Sig Dispense Auth. Provider   ondansetron (ZOFRAN-ODT) 4 MG disintegrating tablet Take 1 tablet (4 mg total) by mouth every 8 (eight) hours as needed for nausea or vomiting. 20 tablet Eulogio Bear, NP      PDMP not reviewed this encounter.   Eulogio Bear, NP 09/01/22 1538

## 2022-09-01 NOTE — ED Triage Notes (Signed)
Pt reports reports bloated abdomen, abdominal pain and diarrhea over 20 times at night x 3 days. Nauzene , omeprazole, and simethicone gives relief.

## 2022-09-01 NOTE — Discharge Instructions (Addendum)
It sounds like you may have a stomach bug.  If the diarrhea returns tonight, please return a stool sample to [place] tomorrow.  Continue staying hydrating with plenty of fluids and sugar free electrolyte solutions.  You can take the Zofran under your tongue every 8 hours as needed for nausea/vomiting.  Recommend eating easy to digest foods like pudding, applesauce, mashed potatoes, etc. For the next couple of days.  If your symptoms worsen or persist longer than 5 days without improvement, follow up in ER.

## 2022-10-05 ENCOUNTER — Encounter: Payer: Self-pay | Admitting: Internal Medicine

## 2022-10-05 ENCOUNTER — Ambulatory Visit (INDEPENDENT_AMBULATORY_CARE_PROVIDER_SITE_OTHER): Payer: Self-pay | Admitting: Internal Medicine

## 2022-10-05 VITALS — BP 138/82 | HR 92 | Ht 66.0 in | Wt 199.0 lb

## 2022-10-05 DIAGNOSIS — E782 Mixed hyperlipidemia: Secondary | ICD-10-CM

## 2022-10-05 DIAGNOSIS — Z2821 Immunization not carried out because of patient refusal: Secondary | ICD-10-CM

## 2022-10-05 DIAGNOSIS — E559 Vitamin D deficiency, unspecified: Secondary | ICD-10-CM | POA: Insufficient documentation

## 2022-10-05 DIAGNOSIS — Z0001 Encounter for general adult medical examination with abnormal findings: Secondary | ICD-10-CM

## 2022-10-05 DIAGNOSIS — J019 Acute sinusitis, unspecified: Secondary | ICD-10-CM

## 2022-10-05 DIAGNOSIS — G500 Trigeminal neuralgia: Secondary | ICD-10-CM

## 2022-10-05 DIAGNOSIS — R030 Elevated blood-pressure reading, without diagnosis of hypertension: Secondary | ICD-10-CM

## 2022-10-05 LAB — CMP14+EGFR
ALT: 40 IU/L (ref 0–44)
AST: 25 IU/L (ref 0–40)
Albumin/Globulin Ratio: 1.8 (ref 1.2–2.2)
Albumin: 4.6 g/dL (ref 4.1–5.1)
Alkaline Phosphatase: 101 IU/L (ref 44–121)
BUN/Creatinine Ratio: 15 (ref 9–20)
BUN: 15 mg/dL (ref 6–20)
Bilirubin Total: 0.4 mg/dL (ref 0.0–1.2)
CO2: 20 mmol/L (ref 20–29)
Calcium: 9.3 mg/dL (ref 8.7–10.2)
Chloride: 103 mmol/L (ref 96–106)
Creatinine, Ser: 1 mg/dL (ref 0.76–1.27)
Globulin, Total: 2.6 g/dL (ref 1.5–4.5)
Glucose: 95 mg/dL (ref 70–99)
Potassium: 3.9 mmol/L (ref 3.5–5.2)
Sodium: 140 mmol/L (ref 134–144)
Total Protein: 7.2 g/dL (ref 6.0–8.5)
eGFR: 99 mL/min/{1.73_m2} (ref >59)

## 2022-10-05 LAB — TSH: TSH: 2.29 u[IU]/mL (ref 0.450–4.500)

## 2022-10-05 LAB — LIPID PANEL
Chol/HDL Ratio: 4.3 ratio (ref 0.0–5.0)
Cholesterol, Total: 178 mg/dL (ref 100–199)
HDL: 41 mg/dL (ref >39)
LDL Chol Calc (NIH): 115 mg/dL — ABNORMAL HIGH (ref 0–99)
Triglycerides: 119 mg/dL (ref 0–149)
VLDL Cholesterol Cal: 22 mg/dL (ref 5–40)

## 2022-10-05 LAB — CBC WITH DIFFERENTIAL/PLATELET
Basophils Absolute: 0 10*3/uL (ref 0.0–0.2)
Basos: 0 %
EOS (ABSOLUTE): 0.2 10*3/uL (ref 0.0–0.4)
Eos: 3 %
Hematocrit: 44.6 % (ref 37.5–51.0)
Hemoglobin: 15.5 g/dL (ref 13.0–17.7)
Immature Grans (Abs): 0 10*3/uL (ref 0.0–0.1)
Immature Granulocytes: 0 %
Lymphocytes Absolute: 2.5 10*3/uL (ref 0.7–3.1)
Lymphs: 30 %
MCH: 30 pg (ref 26.6–33.0)
MCHC: 34.8 g/dL (ref 31.5–35.7)
MCV: 86 fL (ref 79–97)
Monocytes Absolute: 0.5 10*3/uL (ref 0.1–0.9)
Monocytes: 6 %
Neutrophils Absolute: 5.1 10*3/uL (ref 1.4–7.0)
Neutrophils: 61 %
Platelets: 263 10*3/uL (ref 150–450)
RBC: 5.16 x10E6/uL (ref 4.14–5.80)
RDW: 12.6 % (ref 11.6–15.4)
WBC: 8.4 10*3/uL (ref 3.4–10.8)

## 2022-10-05 LAB — HEMOGLOBIN A1C
Est. average glucose Bld gHb Est-mCnc: 111 mg/dL
Hgb A1c MFr Bld: 5.5 % (ref 4.8–5.6)

## 2022-10-05 LAB — POCT INFLUENZA A/B
Influenza A, POC: NEGATIVE
Influenza B, POC: NEGATIVE

## 2022-10-05 LAB — VITAMIN D 25 HYDROXY (VIT D DEFICIENCY, FRACTURES): Vit D, 25-Hydroxy: 19.8 ng/mL — ABNORMAL LOW (ref 30.0–100.0)

## 2022-10-05 MED ORDER — NOREL AD 4-10-325 MG PO TABS: 1.0000 | ORAL_TABLET | Freq: Three times a day (TID) | ORAL | 0 refills | Status: DC | PRN

## 2022-10-05 NOTE — Patient Instructions (Addendum)
Please start taking Norel AD for sinus symptoms.  Please start taking Vitamin D 5000 IU once daily.  Please continue to follow DASH diet and perform moderate exercise/walking at least 150 mins/week.

## 2022-10-05 NOTE — Assessment & Plan Note (Signed)
Lipid profile reviewed Advised to follow low cholesterol diet for now

## 2022-10-05 NOTE — Assessment & Plan Note (Signed)
Physical exam as documented. Fasting blood test reviewed. Diet modification and moderate exercise advised.

## 2022-10-05 NOTE — Progress Notes (Signed)
Established Patient Office Visit  Subjective:  Patient ID: Matthew Mitchell, male    DOB: Apr 07, 1986  Age: 37 y.o. MRN: [id number]  CC:  Chief Complaint  Patient presents with   Annual Exam    Patient gets a sharp pain in the top left side of the forehead.    HPI Matthew Mitchell is a 37 y.o. male with past medical history of migraine and GERD who presents for annual physical.  He reports left sided forehead pain/tenderness since 08/11/22. He has electric shock like sensation that radiates towards the left temporal area, lasting for few seconds. He has pain when he touches that area. His pain is different than his migraine episodes. Denies any visual disturbance.  His BP was borderline elevated, which could be due to Robitussin.  Denies any headache, dizziness, chest pain, dyspnea or palpitations.  He reports nasal congestion and sinus pressure since last night.  Denies any fever or chills.  Denies any dyspnea or wheezing.  He has tried taking Robitussin DM for it without much relief.     History reviewed. No pertinent past medical history.  History reviewed. No pertinent surgical history.  History reviewed. No pertinent family history.  Social History   Socioeconomic History   Marital status: Married    Spouse name: Not on file   Number of children: Not on file   Years of education: Not on file   Highest education level: Not on file  Occupational History   Not on file  Tobacco Use   Smoking status: Never   Smokeless tobacco: Never  Vaping Use   Vaping Use: Never used  Substance and Sexual Activity   Alcohol use: Yes    Alcohol/week: 1.0 standard drink of alcohol    Types: 1 Standard drinks or equivalent per week    Comment: Occ   Drug use: Never   Sexual activity: Yes  Other Topics Concern   Not on file  Social History Narrative   Not on file   Social Determinants of Health   Financial Resource Strain: Not on file  Food Insecurity: Not on file   Transportation Needs: Not on file  Physical Activity: Not on file  Stress: Not on file  Social Connections: Not on file  Intimate Partner Violence: Not on file    Outpatient Medications Prior to Visit  Medication Sig Dispense Refill   omeprazole (PRILOSEC) 20 MG capsule TAKE 1 CAPSULE(20 MG) BY MOUTH DAILY 90 capsule 1   ondansetron (ZOFRAN-ODT) 4 MG disintegrating tablet Take 1 tablet (4 mg total) by mouth every 8 (eight) hours as needed for nausea or vomiting. (Patient not taking: Reported on 10/05/2022) 20 tablet 0   simethicone (MYLICON) 80 MG chewable tablet Chew 80 mg by mouth every 6 (six) hours as needed for flatulence. (Patient not taking: Reported on 10/05/2022)     SUMAtriptan (IMITREX) 50 MG tablet Take 1 tablet (50 mg total) by mouth every 2 (two) hours as needed for migraine. May repeat in 2 hours if headache persists or recurs. (Patient not taking: Reported on 10/05/2022) 10 tablet 1   No facility-administered medications prior to visit.    No Known Allergies  ROS Review of Systems  Constitutional:  Negative for chills and fever.  HENT:  Positive for congestion and sinus pressure. Negative for sore throat.   Eyes:  Negative for pain and discharge.  Respiratory:  Negative for cough and shortness of breath.   Cardiovascular:  Negative for chest pain and palpitations.  Gastrointestinal:  Negative for diarrhea and vomiting.  Endocrine: Negative for polydipsia and polyuria.  Genitourinary:  Negative for dysuria and hematuria.  Musculoskeletal:  Negative for neck pain and neck stiffness.  Skin:  Negative for rash.  Neurological:  Positive for headaches. Negative for dizziness, weakness and numbness.  Psychiatric/Behavioral:  Negative for agitation and behavioral problems.       Objective:    Physical Exam Vitals reviewed.  Constitutional:      General: He is not in acute distress.    Appearance: He is not diaphoretic.  HENT:     Head: Normocephalic and atraumatic.      Nose: Congestion present.     Mouth/Throat:     Mouth: Mucous membranes are moist.  Eyes:     General: No scleral icterus.    Extraocular Movements: Extraocular movements intact.  Cardiovascular:     Rate and Rhythm: Normal rate and regular rhythm.     Pulses: Normal pulses.     Heart sounds: Normal heart sounds. No murmur heard. Pulmonary:     Breath sounds: Normal breath sounds. No wheezing or rales.  Abdominal:     Palpations: Abdomen is soft.     Tenderness: There is no abdominal tenderness.  Musculoskeletal:     Cervical back: Neck supple. No tenderness.     Right lower leg: No edema.     Left lower leg: No edema.  Skin:    General: Skin is warm.     Findings: No rash.  Neurological:     General: No focal deficit present.     Mental Status: He is alert and oriented to person, place, and time.     Cranial Nerves: No cranial nerve deficit.     Sensory: No sensory deficit.     Motor: No weakness.     Comments: Sensitivity/tenderness in left forehead area  Psychiatric:        Mood and Affect: Mood normal.        Behavior: Behavior normal.     BP 138/82 (BP Location: Left Arm, Cuff Size: Normal)   Pulse 92   Ht 5\' 6"  (1.676 m)   Wt 199 lb (90.3 kg)   SpO2 96%   BMI 32.12 kg/m  Wt Readings from Last 3 Encounters:  10/05/22 199 lb (90.3 kg)  02/08/22 197 lb 9.6 oz (89.6 kg)  12/28/21 198 lb (89.8 kg)    Lab Results  Component Value Date   TSH 2.290 10/04/2022   Lab Results  Component Value Date   WBC 8.4 10/04/2022   HGB 15.5 10/04/2022   HCT 44.6 10/04/2022   MCV 86 10/04/2022   PLT 263 10/04/2022   Lab Results  Component Value Date   NA 140 10/04/2022   K 3.9 10/04/2022   CO2 20 10/04/2022   GLUCOSE 95 10/04/2022   BUN 15 10/04/2022   CREATININE 1.00 10/04/2022   BILITOT 0.4 10/04/2022   ALKPHOS 101 10/04/2022   AST 25 10/04/2022   ALT 40 10/04/2022   PROT 7.2 10/04/2022   ALBUMIN 4.6 10/04/2022   CALCIUM 9.3 10/04/2022   EGFR 99  10/04/2022   Lab Results  Component Value Date   CHOL 178 10/04/2022   Lab Results  Component Value Date   HDL 41 10/04/2022   Lab Results  Component Value Date   LDLCALC 115 (H) 10/04/2022   Lab Results  Component Value Date   TRIG 119 10/04/2022   Lab Results  Component Value Date   CHOLHDL 4.3  10/04/2022   Lab Results  Component Value Date   HGBA1C 5.5 10/04/2022      Assessment & Plan:   Problem List Items Addressed This Visit       Nervous and Auditory   Trigeminal neuralgia of left side of face    Left-sided facial hypersensitivity could be trigeminal neuralgia Temporal arteritis can also be differential, but his area of pain is less suggestive of temporal arteritis Describes it different than migraine Referred to neurology      Relevant Orders   Ambulatory referral to Neurology     Other   Encounter for general adult medical examination with abnormal findings - Primary    Physical exam as documented. Fasting blood test reviewed. Diet modification and moderate exercise advised.      Mixed hyperlipidemia    Lipid profile reviewed Advised to follow low cholesterol diet for now      Prehypertension    BP Readings from Last 1 Encounters:  10/05/22 138/82  Well-controlled Could be elevated due to recent intake of dextromethorphan Advised DASH diet and moderate exercise/walking, at least 150 mins/week      Refused influenza vaccine   Vitamin D deficiency    Advised to take Vitamin D 5000 IU QD      Other Visit Diagnoses     Acute sinusitis, recurrence not specified, unspecified location     Check rapid flu and COVID RT-PCR Norel AD as needed for nasal congestion   Relevant Medications   Chlorphen-PE-Acetaminophen (NOREL AD) 4-10-325 MG TABS   Other Relevant Orders   POCT Influenza A/B (Completed)   Novel Coronavirus, NAA ([facility])       Meds ordered this encounter  Medications   Chlorphen-PE-Acetaminophen (NOREL AD) 4-10-325 MG  TABS    Sig: Take 1 tablet by mouth 3 (three) times daily as needed (Nasal congestion).    Dispense:  30 tablet    Refill:  0    Follow-up: Return in about 6 months (around 04/05/2023) for Prehypertension.    Lindell Spar, MD

## 2022-10-05 NOTE — Assessment & Plan Note (Addendum)
BP Readings from Last 1 Encounters:  10/05/22 138/82   Well-controlled Could be elevated due to recent intake of dextromethorphan Advised DASH diet and moderate exercise/walking, at least 150 mins/week

## 2022-10-05 NOTE — Assessment & Plan Note (Signed)
Advised to take Vitamin D 5000 IU QD 

## 2022-10-05 NOTE — Assessment & Plan Note (Signed)
Left-sided facial hypersensitivity could be trigeminal neuralgia Temporal arteritis can also be differential, but his area of pain is less suggestive of temporal arteritis Describes it different than migraine Referred to neurology

## 2022-10-07 LAB — NOVEL CORONAVIRUS, NAA: SARS-CoV-2, NAA: NOT DETECTED

## 2022-11-24 ENCOUNTER — Ambulatory Visit (INDEPENDENT_AMBULATORY_CARE_PROVIDER_SITE_OTHER): Payer: No Typology Code available for payment source | Admitting: Psychiatry

## 2022-11-24 ENCOUNTER — Encounter: Payer: Self-pay | Admitting: Psychiatry

## 2022-11-24 VITALS — BP 148/91 | HR 86 | Ht 65.0 in | Wt 202.6 lb

## 2022-11-24 DIAGNOSIS — G500 Trigeminal neuralgia: Secondary | ICD-10-CM | POA: Diagnosis not present

## 2022-11-24 NOTE — Progress Notes (Signed)
[facility]  PATIENT: Matthew Mitchell DOB: Jul 13, 1986  REFERRING CLINICIAN: Lindell Spar, MD HISTORY FROM: self, wife REASON FOR VISIT: facial pain   HISTORICAL  CHIEF COMPLAINT:  Chief Complaint  Patient presents with   Room 2    Pt is here with his Wife and Son. Pt states that symptoms started 1 year ago. Pt states that his left temple causes him pain. Pt states that its not a constant pain but when pressure is on his left temple pt gets a shocking pain. Pt states that when he does have the shocking pain it's and 8 on a 1-10 Scale.     HISTORY OF PRESENT ILLNESS:  The patient presents for evaluation of facial pain which began one year ago. Pain is described as electrical shocking in his left forehead which radiates up his left vertex. This lasts for only 1-2 seconds at a time. IT is triggered by touching his forehead. Denies occipital pain, headaches, or vision changes. It is not associated with autonomic symptoms. He will occasionally have paresthesias over his left forehead.  FACIAL PAIN FEATURES  Side: left temple Distribution: left V1 Any pain on side or back of head: no Character: electrical shock, tenderness Duration: 1 seconds Pain-free between episodes: yes Triggers: touching forehead/temple, wearing hat Sensory abnormalities: paresthesias over left forehead Tried Tegretol/Trileptal: none Prior procedures/outcome: none History of MS, Lyme's disease, facial rash: no History of dental/oral surgery, facial/plastic surgery: no   OTHER MEDICAL CONDITIONS: none   REVIEW OF SYSTEMS: Full 14 system review of systems performed and negative with exception of: facial pain  ALLERGIES: No Known Allergies  HOME MEDICATIONS: Outpatient Medications Prior to Visit  Medication Sig Dispense Refill   Chlorphen-PE-Acetaminophen (NOREL AD) 4-10-325 MG TABS Take 1 tablet by mouth 3 (three) times daily as needed (Nasal congestion). 30 tablet 0   omeprazole  (PRILOSEC) 20 MG capsule TAKE 1 CAPSULE(20 MG) BY MOUTH DAILY 90 capsule 1   ondansetron (ZOFRAN-ODT) 4 MG disintegrating tablet Take 1 tablet (4 mg total) by mouth every 8 (eight) hours as needed for nausea or vomiting. (Patient not taking: Reported on 10/05/2022) 20 tablet 0   simethicone (MYLICON) 80 MG chewable tablet Chew 80 mg by mouth every 6 (six) hours as needed for flatulence. (Patient not taking: Reported on 10/05/2022)     SUMAtriptan (IMITREX) 50 MG tablet Take 1 tablet (50 mg total) by mouth every 2 (two) hours as needed for migraine. May repeat in 2 hours if headache persists or recurs. (Patient not taking: Reported on 10/05/2022) 10 tablet 1   No facility-administered medications prior to visit.    PAST MEDICAL HISTORY: History reviewed. No pertinent past medical history.  PAST SURGICAL HISTORY: History reviewed. No pertinent surgical history.  FAMILY HISTORY: History reviewed. No pertinent family history.  SOCIAL HISTORY: Social History   Socioeconomic History   Marital status: Married    Spouse name: Not on file   Number of children: Not on file   Years of education: Not on file   Highest education level: Not on file  Occupational History   Not on file  Tobacco Use   Smoking status: Never   Smokeless tobacco: Never  Vaping Use   Vaping Use: Never used  Substance and Sexual Activity   Alcohol use: Yes    Alcohol/week: 1.0 standard drink of alcohol    Types: 1 Standard drinks or equivalent per week    Comment: Occ   Drug use: Never   Sexual activity:  Yes  Other Topics Concern   Not on file  Social History Narrative   Not on file   Social Determinants of Health   Financial Resource Strain: Not on file  Food Insecurity: Not on file  Transportation Needs: Not on file  Physical Activity: Not on file  Stress: Not on file  Social Connections: Not on file  Intimate Partner Violence: Not on file     PHYSICAL EXAM  GENERAL EXAM/CONSTITUTIONAL: Vitals:   Vitals:   11/24/22 1101  BP: (!) 148/91  Pulse: 86  Weight: 202 lb 9.6 oz (91.9 kg)  Height: 5\' 5"  (1.651 m)   Body mass index is 33.71 kg/m. Wt Readings from Last 3 Encounters:  11/24/22 202 lb 9.6 oz (91.9 kg)  10/05/22 199 lb (90.3 kg)  02/08/22 197 lb 9.6 oz (89.6 kg)    NEUROLOGIC: MENTAL STATUS:  awake, alert, oriented to person, place and time recent and remote memory intact normal attention and concentration language fluent, comprehension intact, naming intact fund of knowledge appropriate  CRANIAL NERVE:  2nd, 3rd, 4th, 6th - pupils equal and reactive to light, visual fields full to confrontation, extraocular muscles intact, no nystagmus 5th - facial sensation symmetric 7th - facial strength symmetric 8th - hearing intact 9th - palate elevates symmetrically, uvula midline 11th - shoulder shrug symmetric 12th - tongue protrusion midline  MOTOR:  normal bulk and tone, full strength in the BUE, BLE  SENSORY:  normal and symmetric to light touch all 4 extremities  COORDINATION:  finger-nose-finger intact bilaterally  REFLEXES:  deep tendon reflexes present and symmetric  GAIT/STATION:  normal     DIAGNOSTIC DATA (LABS, IMAGING, TESTING) - I reviewed patient records, labs, notes, testing and imaging myself where available.  Lab Results  Component Value Date   WBC 8.4 10/04/2022   HGB 15.5 10/04/2022   HCT 44.6 10/04/2022   MCV 86 10/04/2022   PLT 263 10/04/2022      Component Value Date/Time   NA 140 10/04/2022 0813   K 3.9 10/04/2022 0813   CL 103 10/04/2022 0813   CO2 20 10/04/2022 0813   GLUCOSE 95 10/04/2022 0813   BUN 15 10/04/2022 0813   CREATININE 1.00 10/04/2022 0813   CALCIUM 9.3 10/04/2022 0813   PROT 7.2 10/04/2022 0813   ALBUMIN 4.6 10/04/2022 0813   AST 25 10/04/2022 0813   ALT 40 10/04/2022 0813   ALKPHOS 101 10/04/2022 0813   BILITOT 0.4 10/04/2022 0813   Lab Results  Component Value Date   CHOL 178 10/04/2022   HDL  41 10/04/2022   LDLCALC 115 (H) 10/04/2022   TRIG 119 10/04/2022   CHOLHDL 4.3 10/04/2022   Lab Results  Component Value Date   HGBA1C 5.5 10/04/2022   No results found for: "VITAMINB12" Lab Results  Component Value Date   TSH 2.290 10/04/2022      ASSESSMENT AND PLAN  37 y.o. male without significant medical history who presents for evaluation of left sided facial pain. Will order MRI trigeminal protocol to rule out underlying structural process causing facial pain/paresthesias including vascular compression of the trigeminal nerve. Suspect his pain represents supraorbital neuralgia as it is primarily in the distribution of the supraorbital nerve. Discussed treatment options, however he does not find the pain to be bothersome enough to warrant treatment at this time. Could consider oxcarbazepine, gabapentin, or supraorbital nerve block in the future if pain becomes bothersome.   1. Supraorbital neuralgia   2. Trigeminal neuralgia of left side  of face       PLAN: -MRI trigeminal protocol -Next steps: consider supraorbital nerve block, gabapentin, oxcarbazepine if pain worsens  Orders Placed This Encounter  Procedures   MR FACE/TRIGEMINAL W/CM    No orders of the defined types were placed in this encounter.   Return after testing. Or as needed if pain worsens.    Genia Harold, MD 11/24/22 11:49 AM  I spent an average of 25 minutes chart reviewing and counseling the patient, with at least 50% of the time face to face with the patient.   [facility] [address] [place], [place] [address] [phone]

## 2022-11-30 ENCOUNTER — Telehealth: Payer: Self-pay | Admitting: Psychiatry

## 2022-11-30 NOTE — Telephone Encounter (Signed)
[facility] case [id number] sent to GI [phone]

## 2022-12-20 ENCOUNTER — Other Ambulatory Visit: Payer: Self-pay

## 2022-12-21 IMAGING — DX DG ANKLE COMPLETE 3+V*L*
3 series · 3 of 3 positions shown · non-contrast
Comparison: None.

CLINICAL DATA: Baseball injury, left ankle pain

EXAM:
LEFT ANKLE COMPLETE - 3+ VIEW

[ankle ap]
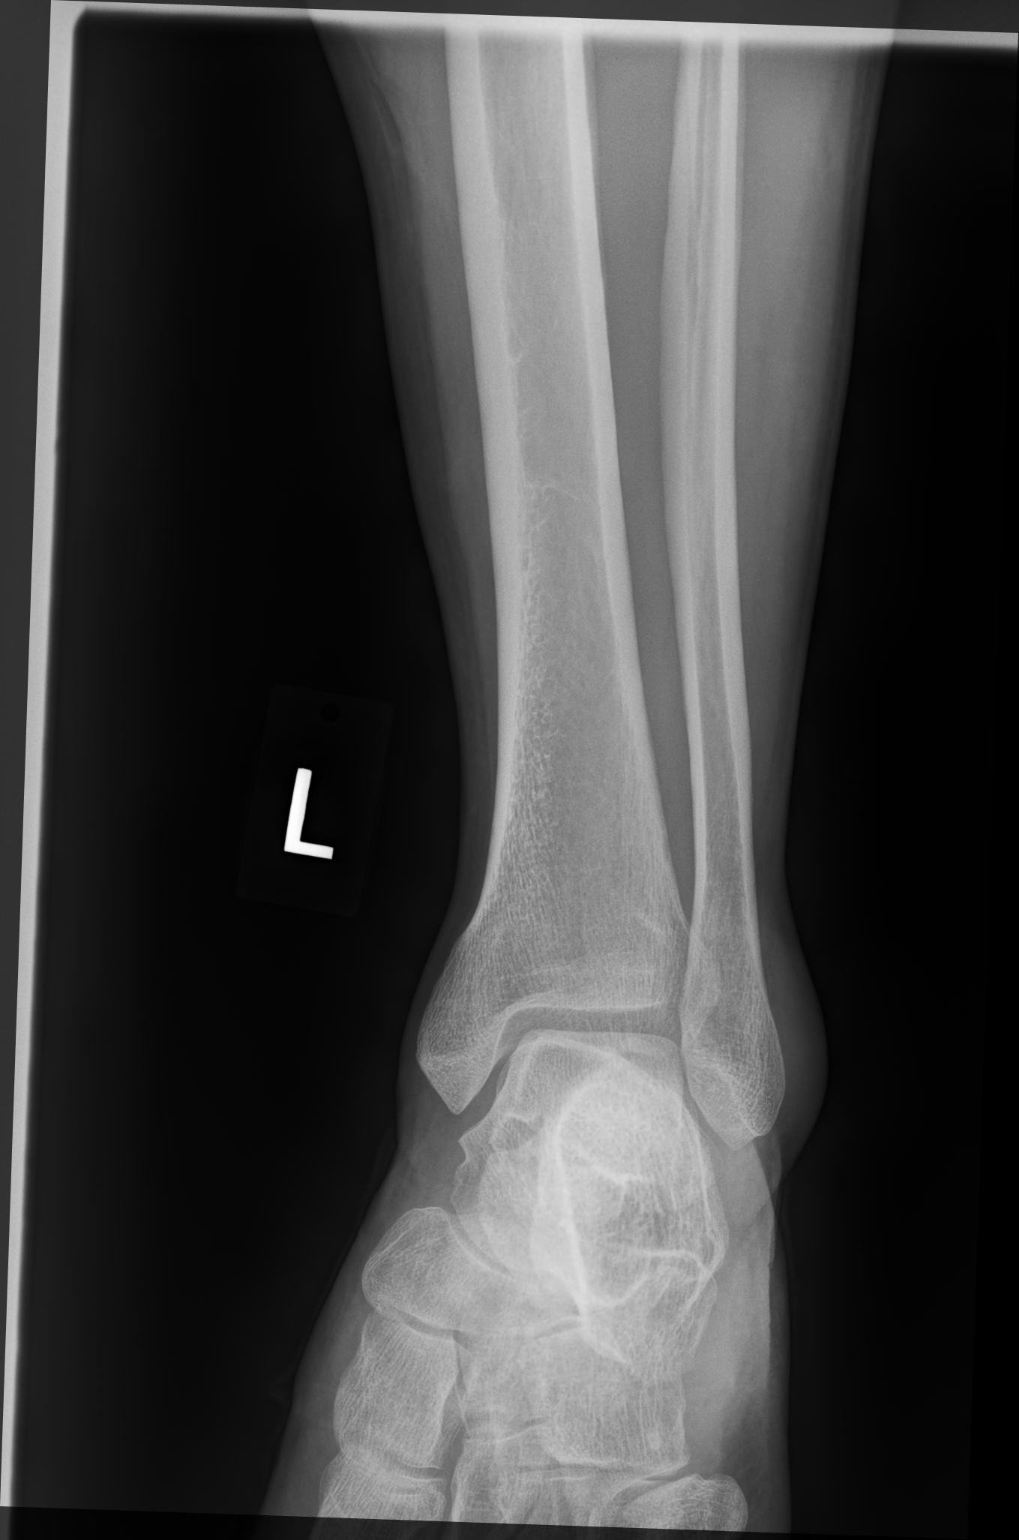

[ankle mlo]
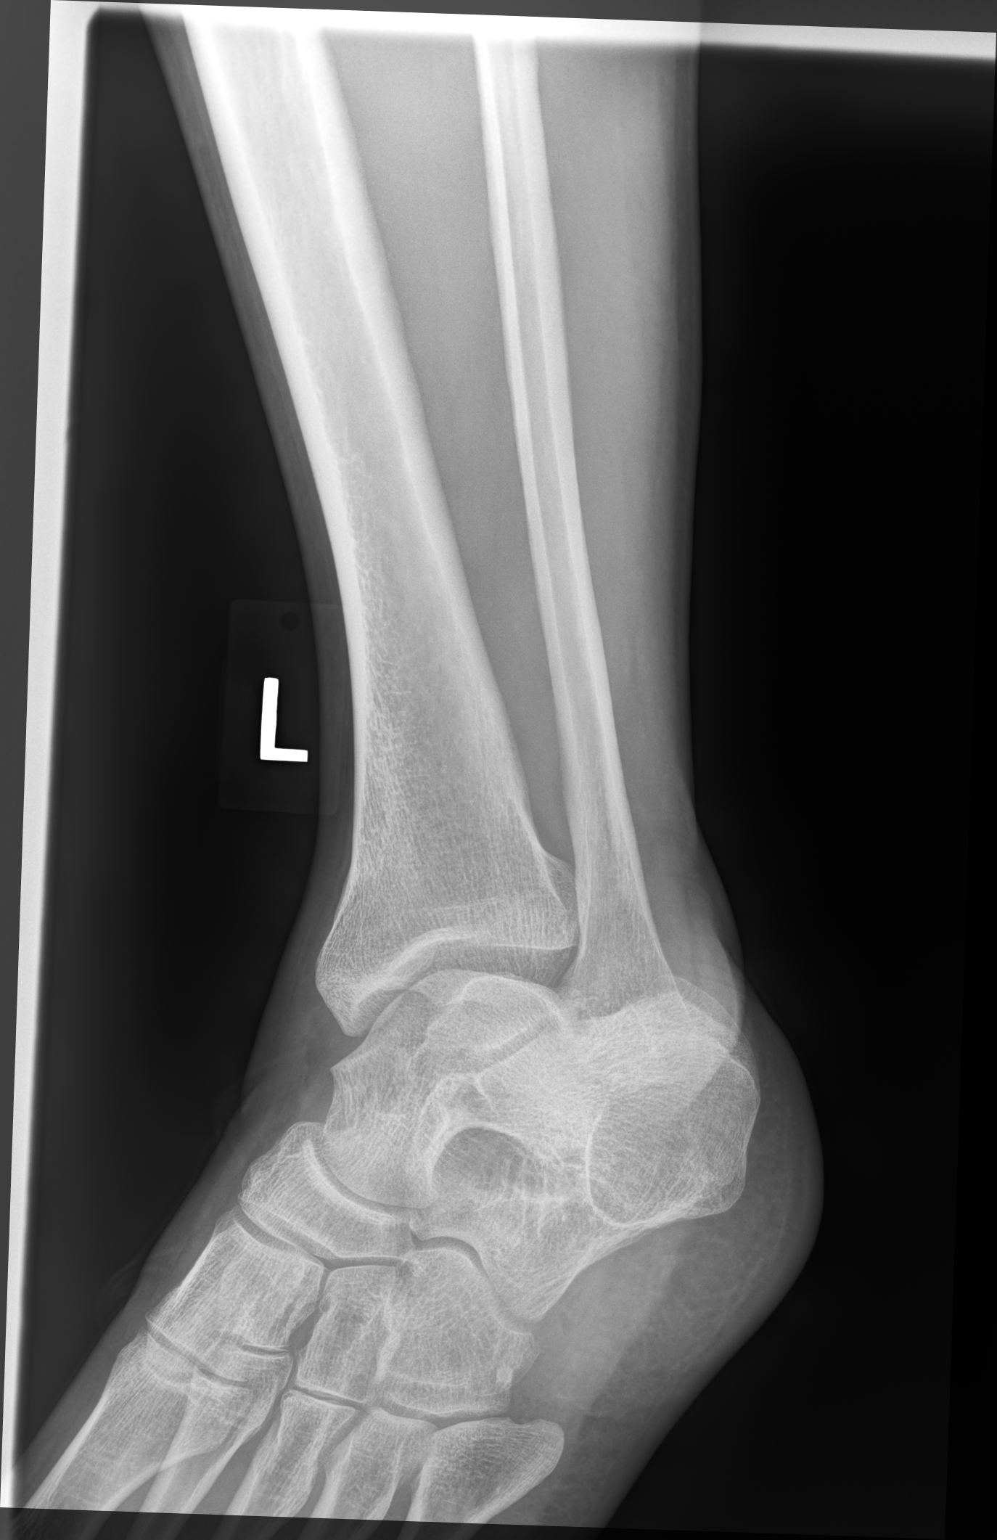

[ankle lat]
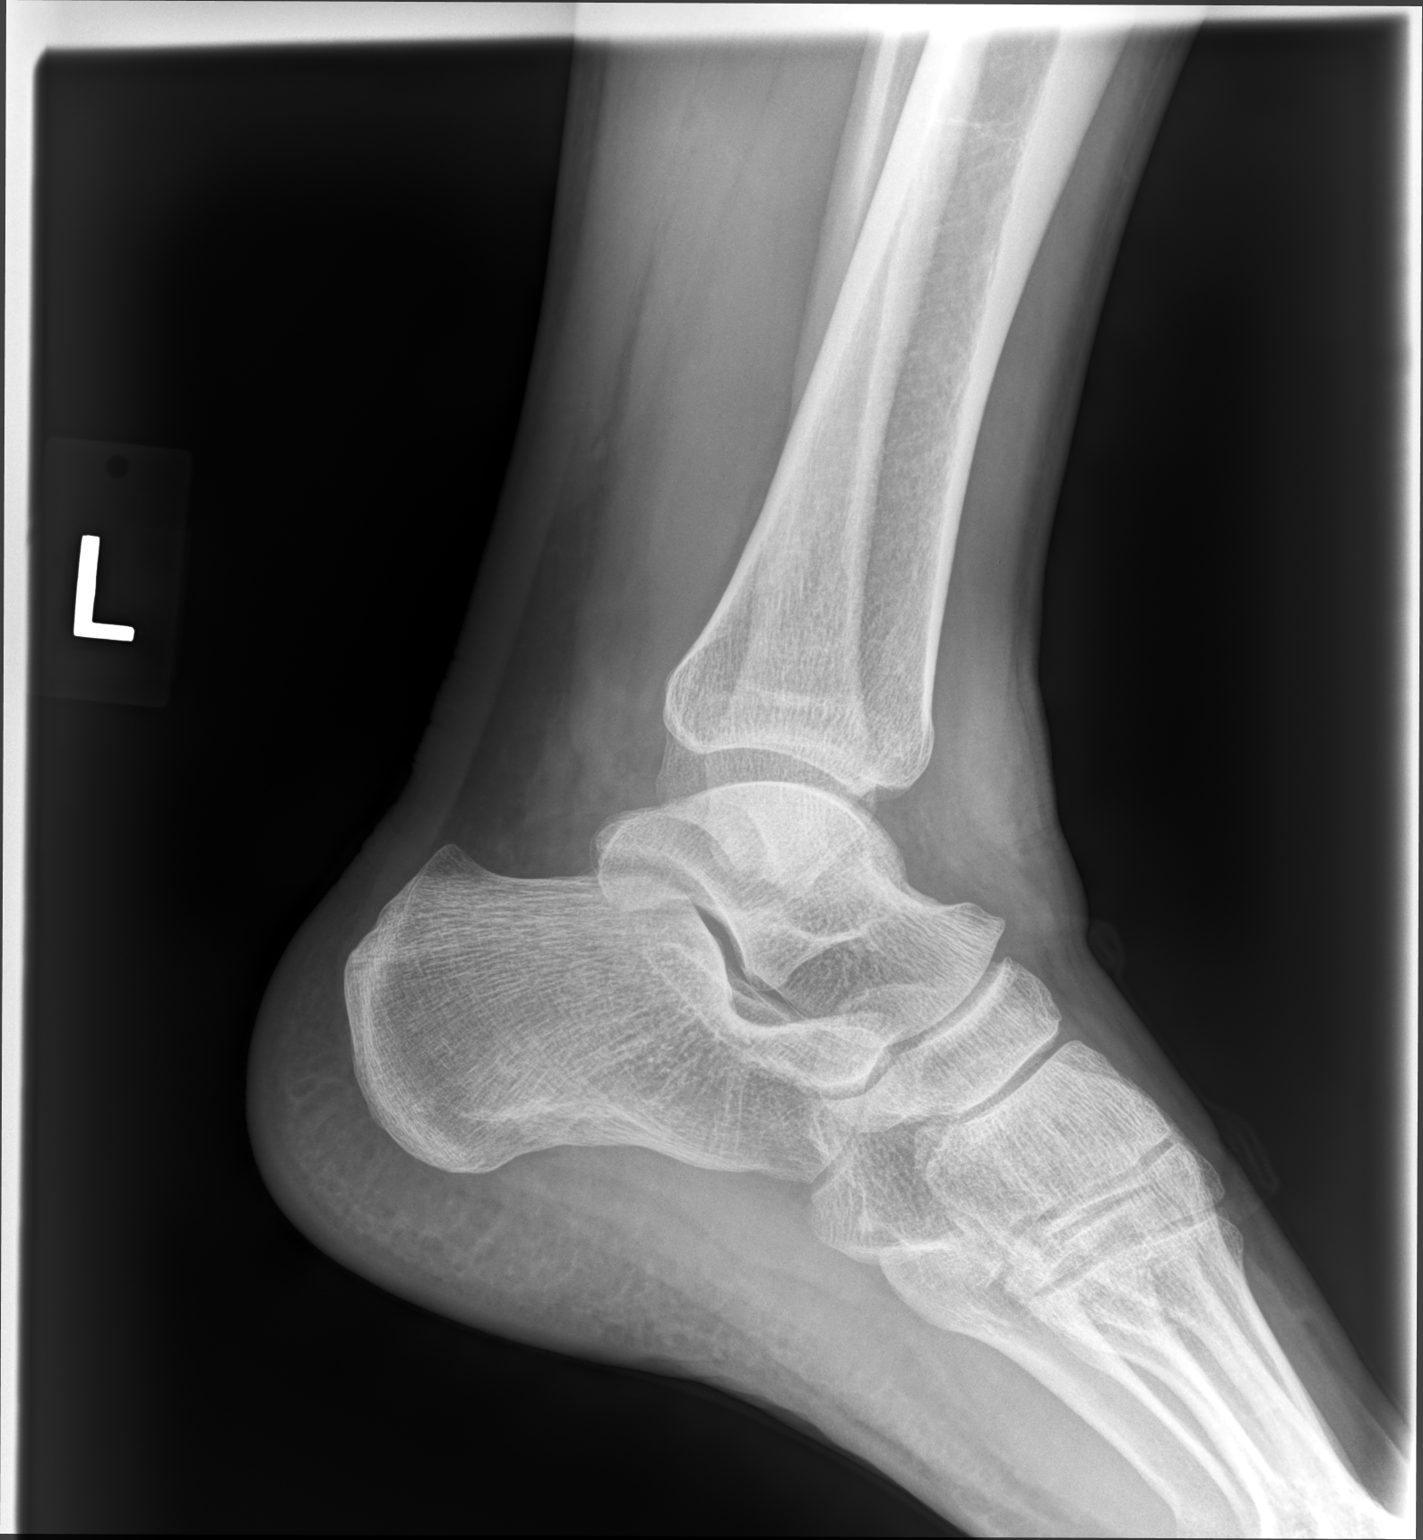

[3 of 3 positions shown; findings below may reference images not displayed]

FINDINGS: Frontal, oblique, and lateral views of the left ankle are obtained.
No evidence of acute fracture. There is slight widening of the ankle
mortise posteriorly and laterally, which may reflect underlying
ligamentous injury or laxity. No dislocation. Prominent anterior and
lateral soft tissue swelling.
IMPRESSION: 1. No acute displaced fracture.
2. Slight widening of the ankle mortise laterally and posteriorly
which may reflect ligamentous injury or laxity.
3. Anterolateral soft tissue swelling.

## 2023-01-12 ENCOUNTER — Other Ambulatory Visit: Payer: Self-pay

## 2023-04-05 ENCOUNTER — Ambulatory Visit (INDEPENDENT_AMBULATORY_CARE_PROVIDER_SITE_OTHER): Payer: No Typology Code available for payment source | Admitting: Internal Medicine

## 2023-04-05 ENCOUNTER — Encounter: Payer: Self-pay | Admitting: Internal Medicine

## 2023-04-05 VITALS — BP 138/76 | HR 81 | Ht 66.0 in | Wt 196.6 lb

## 2023-04-05 DIAGNOSIS — I10 Essential (primary) hypertension: Secondary | ICD-10-CM | POA: Diagnosis not present

## 2023-04-05 DIAGNOSIS — G500 Trigeminal neuralgia: Secondary | ICD-10-CM | POA: Diagnosis not present

## 2023-04-05 DIAGNOSIS — E782 Mixed hyperlipidemia: Secondary | ICD-10-CM

## 2023-04-05 DIAGNOSIS — K219 Gastro-esophageal reflux disease without esophagitis: Secondary | ICD-10-CM

## 2023-04-05 MED ORDER — OMEPRAZOLE 20 MG PO CPDR: 20.0000 mg | DELAYED_RELEASE_CAPSULE | Freq: Every day | ORAL | 1 refills | Status: DC

## 2023-04-05 MED ORDER — LISINOPRIL 2.5 MG PO TABS: 2.5000 mg | ORAL_TABLET | Freq: Every day | ORAL | 2 refills | Status: DC

## 2023-04-05 NOTE — Patient Instructions (Signed)
Please start taking Lisinopril 2.5 mg once daily.  Please follow DASH diet and perform moderate exercise/walking at least 150 mins/week.  Please take Colace 100 mg for constipation. Please maintain at least 64 ounces of fluid in a day.

## 2023-04-05 NOTE — Progress Notes (Signed)
Established Patient Office Visit  Subjective:  Patient ID: Matthew Mitchell, male    DOB: 06-Jun-1986  Age: 37 y.o. MRN: [id number]  CC:  Chief Complaint  Patient presents with   prehypertension     Patient is concerned for his blood pressure     HPI Matthew Mitchell is a 37 y.o. male with past medical history of migraine and GERD who presents for f/u of his chronic medical conditions.  HTN: His BP was borderline  elevated today.  He has had BP in 140s/80s at home.  His mother has history of HTN.  Denies any chest pain, dyspnea or palpitations currently.  He had neurology evaluation for left-sided facial pain, and was told of trigeminal neuralgia as well.  Since his symptoms are mild and manageable, he decided to avoid medical treatment for now.  He still needs to get MRI of trigeminal nerve area/face.  His headache has improved now. He had to take Sumatriptan only once for it. He denies any visual disturbance, dizziness or numbness/tingling of extremities.  He takes omeprazole as needed for epigastric pain.  Denies any dysphagia or odynophagia.  History reviewed. No pertinent past medical history.  History reviewed. No pertinent surgical history.  History reviewed. No pertinent family history.  Social History   Socioeconomic History   Marital status: Married    Spouse name: Not on file   Number of children: Not on file   Years of education: Not on file   Highest education level: Not on file  Occupational History   Not on file  Tobacco Use   Smoking status: Never   Smokeless tobacco: Never  Vaping Use   Vaping status: Never Used  Substance and Sexual Activity   Alcohol use: Yes    Alcohol/week: 1.0 standard drink of alcohol    Types: 1 Standard drinks or equivalent per week    Comment: Occ   Drug use: Never   Sexual activity: Yes  Other Topics Concern   Not on file  Social History Narrative   Not on file   Social Determinants of Health   Financial Resource Strain:  Not on file  Food Insecurity: Not on file  Transportation Needs: Not on file  Physical Activity: Not on file  Stress: Not on file  Social Connections: Not on file  Intimate Partner Violence: Not on file    Outpatient Medications Prior to Visit  Medication Sig Dispense Refill   ondansetron (ZOFRAN-ODT) 4 MG disintegrating tablet Take 1 tablet (4 mg total) by mouth every 8 (eight) hours as needed for nausea or vomiting. (Patient not taking: Reported on 10/05/2022) 20 tablet 0   simethicone (MYLICON) 80 MG chewable tablet Chew 80 mg by mouth every 6 (six) hours as needed for flatulence. (Patient not taking: Reported on 10/05/2022)     SUMAtriptan (IMITREX) 50 MG tablet Take 1 tablet (50 mg total) by mouth every 2 (two) hours as needed for migraine. May repeat in 2 hours if headache persists or recurs. (Patient not taking: Reported on 10/05/2022) 10 tablet 1   Chlorphen-PE-Acetaminophen (NOREL AD) 4-10-325 MG TABS Take 1 tablet by mouth 3 (three) times daily as needed (Nasal congestion). 30 tablet 0   omeprazole (PRILOSEC) 20 MG capsule TAKE 1 CAPSULE(20 MG) BY MOUTH DAILY 90 capsule 1   No facility-administered medications prior to visit.    No Known Allergies  ROS Review of Systems  Constitutional:  Negative for chills and fever.  HENT:  Negative for congestion and sore throat.  Eyes:  Negative for pain and discharge.  Respiratory:  Negative for cough and shortness of breath.   Cardiovascular:  Negative for chest pain and palpitations.  Gastrointestinal:  Positive for abdominal pain (Mild, epigastric). Negative for diarrhea and vomiting.  Endocrine: Negative for polydipsia and polyuria.  Genitourinary:  Negative for dysuria and hematuria.  Musculoskeletal:  Negative for neck pain and neck stiffness.  Skin:  Negative for rash.  Neurological:  Positive for headaches. Negative for dizziness, weakness and numbness.  Psychiatric/Behavioral:  Negative for agitation and behavioral problems.        Objective:    Physical Exam Vitals reviewed.  Constitutional:      General: He is not in acute distress.    Appearance: He is not diaphoretic.  HENT:     Head: Normocephalic and atraumatic.     Nose: Nose normal.     Mouth/Throat:     Mouth: Mucous membranes are moist.  Eyes:     General: No scleral icterus.    Extraocular Movements: Extraocular movements intact.  Cardiovascular:     Rate and Rhythm: Normal rate and regular rhythm.     Pulses: Normal pulses.     Heart sounds: Normal heart sounds. No murmur heard. Pulmonary:     Breath sounds: Normal breath sounds. No wheezing or rales.  Abdominal:     Palpations: Abdomen is soft.     Tenderness: There is no abdominal tenderness.  Musculoskeletal:     Cervical back: Neck supple. No tenderness.     Right lower leg: No edema.     Left lower leg: No edema.  Skin:    General: Skin is warm.     Findings: No rash.  Neurological:     General: No focal deficit present.     Mental Status: He is alert and oriented to person, place, and time.     Sensory: No sensory deficit.     Motor: No weakness.  Psychiatric:        Mood and Affect: Mood normal.        Behavior: Behavior normal.     BP 138/76 (BP Location: Left Arm)   Pulse 81   Ht 5\' 6"  (1.676 m)   Wt 196 lb 9.6 oz (89.2 kg)   SpO2 98%   BMI 31.73 kg/m  Wt Readings from Last 3 Encounters:  04/05/23 196 lb 9.6 oz (89.2 kg)  11/24/22 202 lb 9.6 oz (91.9 kg)  10/05/22 199 lb (90.3 kg)    Lab Results  Component Value Date   TSH 2.290 10/04/2022   Lab Results  Component Value Date   WBC 8.4 10/04/2022   HGB 15.5 10/04/2022   HCT 44.6 10/04/2022   MCV 86 10/04/2022   PLT 263 10/04/2022   Lab Results  Component Value Date   NA 140 10/04/2022   K 3.9 10/04/2022   CO2 20 10/04/2022   GLUCOSE 95 10/04/2022   BUN 15 10/04/2022   CREATININE 1.00 10/04/2022   BILITOT 0.4 10/04/2022   ALKPHOS 101 10/04/2022   AST 25 10/04/2022   ALT 40 10/04/2022    PROT 7.2 10/04/2022   ALBUMIN 4.6 10/04/2022   CALCIUM 9.3 10/04/2022   EGFR 99 10/04/2022   Lab Results  Component Value Date   CHOL 178 10/04/2022   Lab Results  Component Value Date   HDL 41 10/04/2022   Lab Results  Component Value Date   LDLCALC 115 (H) 10/04/2022   Lab Results  Component Value Date  TRIG 119 10/04/2022   Lab Results  Component Value Date   CHOLHDL 4.3 10/04/2022   Lab Results  Component Value Date   HGBA1C 5.5 10/04/2022      Assessment & Plan:   Problem List Items Addressed This Visit       Cardiovascular and Mediastinum   Essential hypertension - Primary    BP Readings from Last 1 Encounters:  04/05/23 138/76   New onset Started lisinopril 2.5 mg ONCE DAILY Check BMP after 2 weeks Counseled for compliance with the medications Advised DASH diet and moderate exercise/walking, at least 150 mins/week       Relevant Medications   lisinopril (ZESTRIL) 2.5 MG tablet   Other Relevant Orders   Basic Metabolic Panel (BMET)     Digestive   GERD    Chronic epigastric pain likely due to GERD/gastritis On Omeprazole 20 mg QD PRN, refilled Advised to avoid hot and spicy food      Relevant Medications   omeprazole (PRILOSEC) 20 MG capsule     Nervous and Auditory   Trigeminal neuralgia of left side of face    Left-sided facial hypersensitivity  Describes it different than migraine Referred to neurology - was told of trigeminal neuralgia, needs MRI, face/trigeminal nerve        Other   Mixed hyperlipidemia    Lipid profile reviewed Advised to follow low cholesterol diet for now      Relevant Medications   lisinopril (ZESTRIL) 2.5 MG tablet     Meds ordered this encounter  Medications   lisinopril (ZESTRIL) 2.5 MG tablet    Sig: Take 1 tablet (2.5 mg total) by mouth daily.    Dispense:  30 tablet    Refill:  2   omeprazole (PRILOSEC) 20 MG capsule    Sig: Take 1 capsule (20 mg total) by mouth daily.    Dispense:  90  capsule    Refill:  1    **Patient requests 90 days supply**    Follow-up: Return in about 6 weeks (around 05/17/2023) for HTN.    Anabel Halon, MD

## 2023-04-06 NOTE — Assessment & Plan Note (Signed)
Left-sided facial hypersensitivity  Describes it different than migraine Referred to neurology - was told of trigeminal neuralgia, needs MRI, face/trigeminal nerve

## 2023-04-06 NOTE — Assessment & Plan Note (Addendum)
Chronic epigastric pain likely due to GERD/gastritis On Omeprazole 20 mg QD PRN, refilled Advised to avoid hot and spicy food

## 2023-04-06 NOTE — Assessment & Plan Note (Addendum)
BP Readings from Last 1 Encounters:  04/05/23 138/76   New onset Started lisinopril 2.5 mg ONCE DAILY Check BMP after 2 weeks Counseled for compliance with the medications Advised DASH diet and moderate exercise/walking, at least 150 mins/week

## 2023-04-06 NOTE — Assessment & Plan Note (Signed)
Lipid profile reviewed Advised to follow low cholesterol diet for now

## 2023-05-17 ENCOUNTER — Ambulatory Visit (INDEPENDENT_AMBULATORY_CARE_PROVIDER_SITE_OTHER): Payer: No Typology Code available for payment source | Admitting: Internal Medicine

## 2023-05-17 VITALS — BP 126/72 | HR 89 | Ht 66.0 in | Wt 199.4 lb

## 2023-05-17 DIAGNOSIS — Z2821 Immunization not carried out because of patient refusal: Secondary | ICD-10-CM

## 2023-05-17 DIAGNOSIS — I10 Essential (primary) hypertension: Secondary | ICD-10-CM

## 2023-05-17 DIAGNOSIS — K219 Gastro-esophageal reflux disease without esophagitis: Secondary | ICD-10-CM

## 2023-05-17 DIAGNOSIS — G500 Trigeminal neuralgia: Secondary | ICD-10-CM | POA: Diagnosis not present

## 2023-05-17 MED ORDER — LISINOPRIL 2.5 MG PO TABS: 2.5000 mg | ORAL_TABLET | Freq: Every day | ORAL | 1 refills | Status: DC

## 2023-05-17 NOTE — Progress Notes (Signed)
Established Patient Office Visit  Subjective:  Patient ID: Matthew Mitchell, male    DOB: March 01, 1986  Age: 37 y.o. MRN: [id number]  CC:  Chief Complaint  Patient presents with   Hypertension    Follow up     HPI Matthew Mitchell is a 37 y.o. male with past medical history of HTN, migraine and GERD who presents for f/u of his chronic medical conditions.  HTN: BP is wnl today.  He has started taking lisinopril regularly.  He is also trying to improve his diet.  He reports improvement in headache since starting lisinopril as well.  Denies any chest pain, dyspnea or palpitations.  He had neurology evaluation for left-sided facial pain, and was told of trigeminal neuralgia as well. Since his symptoms are mild and manageable, he decided to avoid medical treatment for now.  He takes omeprazole as needed for epigastric pain. Denies any dysphagia or odynophagia.     History reviewed. No pertinent past medical history.  History reviewed. No pertinent surgical history.  History reviewed. No pertinent family history.  Social History   Socioeconomic History   Marital status: Married    Spouse name: Not on file   Number of children: Not on file   Years of education: Not on file   Highest education level: GED or equivalent  Occupational History   Not on file  Tobacco Use   Smoking status: Never   Smokeless tobacco: Never  Vaping Use   Vaping status: Never Used  Substance and Sexual Activity   Alcohol use: Yes    Alcohol/week: 1.0 standard drink of alcohol    Types: 1 Standard drinks or equivalent per week    Comment: Occ   Drug use: Never   Sexual activity: Yes  Other Topics Concern   Not on file  Social History Narrative   Not on file   Social Determinants of Health   Financial Resource Strain: Low Risk  (05/17/2023)   Overall Financial Resource Strain (CARDIA)    Difficulty of Paying Living Expenses: Not hard at all  Food Insecurity: No Food Insecurity (05/17/2023)   Hunger  Vital Sign    Worried About Running Out of Food in the Last Year: Never true    Ran Out of Food in the Last Year: Never true  Transportation Needs: No Transportation Needs (05/17/2023)   PRAPARE - Administrator, Civil Service (Medical): No    Lack of Transportation (Non-Medical): No  Physical Activity: Insufficiently Active (05/17/2023)   Exercise Vital Sign    Days of Exercise per Week: 2 days    Minutes of Exercise per Session: 30 min  Stress: No Stress Concern Present (05/17/2023)   [employer] of Occupational Health - Occupational Stress Questionnaire    Feeling of Stress : Not at all  Social Connections: Moderately Isolated (05/17/2023)   Social Connection and Isolation Panel [NHANES]    Frequency of Communication with Friends and Family: More than three times a week    Frequency of Social Gatherings with Friends and Family: Three times a week    Attends Religious Services: Never    Active Member of Clubs or Organizations: No    Attends Engineer, structural: Not on file    Marital Status: Married  Catering manager Violence: Not on file    Outpatient Medications Prior to Visit  Medication Sig Dispense Refill   omeprazole (PRILOSEC) 20 MG capsule Take 1 capsule (20 mg total) by mouth daily. 90 capsule 1  ondansetron (ZOFRAN-ODT) 4 MG disintegrating tablet Take 1 tablet (4 mg total) by mouth every 8 (eight) hours as needed for nausea or vomiting. (Patient not taking: Reported on 10/05/2022) 20 tablet 0   simethicone (MYLICON) 80 MG chewable tablet Chew 80 mg by mouth every 6 (six) hours as needed for flatulence. (Patient not taking: Reported on 10/05/2022)     SUMAtriptan (IMITREX) 50 MG tablet Take 1 tablet (50 mg total) by mouth every 2 (two) hours as needed for migraine. May repeat in 2 hours if headache persists or recurs. (Patient not taking: Reported on 10/05/2022) 10 tablet 1   lisinopril (ZESTRIL) 2.5 MG tablet Take 1 tablet (2.5 mg total) by mouth daily. 30  tablet 2   No facility-administered medications prior to visit.    No Known Allergies  ROS Review of Systems  Constitutional:  Negative for chills and fever.  HENT:  Negative for congestion and sore throat.   Eyes:  Negative for pain and discharge.  Respiratory:  Negative for cough and shortness of breath.   Cardiovascular:  Negative for chest pain and palpitations.  Gastrointestinal:  Positive for abdominal pain (Mild, epigastric). Negative for diarrhea and vomiting.  Endocrine: Negative for polydipsia and polyuria.  Genitourinary:  Negative for dysuria and hematuria.  Musculoskeletal:  Negative for neck pain and neck stiffness.  Skin:  Negative for rash.  Neurological:  Positive for headaches. Negative for dizziness, weakness and numbness.  Psychiatric/Behavioral:  Negative for agitation and behavioral problems.       Objective:    Physical Exam Vitals reviewed.  Constitutional:      General: He is not in acute distress.    Appearance: He is not diaphoretic.  HENT:     Head: Normocephalic and atraumatic.     Nose: Nose normal.     Mouth/Throat:     Mouth: Mucous membranes are moist.  Eyes:     General: No scleral icterus.    Extraocular Movements: Extraocular movements intact.  Cardiovascular:     Rate and Rhythm: Normal rate and regular rhythm.     Pulses: Normal pulses.     Heart sounds: Normal heart sounds. No murmur heard. Pulmonary:     Breath sounds: Normal breath sounds. No wheezing or rales.  Abdominal:     Palpations: Abdomen is soft.     Tenderness: There is no abdominal tenderness.  Musculoskeletal:     Cervical back: Neck supple. No tenderness.     Right lower leg: No edema.     Left lower leg: No edema.  Skin:    General: Skin is warm.     Findings: No rash.  Neurological:     General: No focal deficit present.     Mental Status: He is alert and oriented to person, place, and time.     Sensory: No sensory deficit.     Motor: No weakness.   Psychiatric:        Mood and Affect: Mood normal.        Behavior: Behavior normal.     BP 126/72 (BP Location: Left Arm, Patient Position: Sitting, Cuff Size: Normal)   Pulse 89   Ht 5\' 6"  (1.676 m)   Wt 199 lb 6.4 oz (90.4 kg)   SpO2 98%   BMI 32.18 kg/m  Wt Readings from Last 3 Encounters:  05/17/23 199 lb 6.4 oz (90.4 kg)  04/05/23 196 lb 9.6 oz (89.2 kg)  11/24/22 202 lb 9.6 oz (91.9 kg)    Lab  Results  Component Value Date   TSH 2.290 10/04/2022   Lab Results  Component Value Date   WBC 8.4 10/04/2022   HGB 15.5 10/04/2022   HCT 44.6 10/04/2022   MCV 86 10/04/2022   PLT 263 10/04/2022   Lab Results  Component Value Date   NA 140 10/04/2022   K 3.9 10/04/2022   CO2 20 10/04/2022   GLUCOSE 95 10/04/2022   BUN 15 10/04/2022   CREATININE 1.00 10/04/2022   BILITOT 0.4 10/04/2022   ALKPHOS 101 10/04/2022   AST 25 10/04/2022   ALT 40 10/04/2022   PROT 7.2 10/04/2022   ALBUMIN 4.6 10/04/2022   CALCIUM 9.3 10/04/2022   EGFR 99 10/04/2022   Lab Results  Component Value Date   CHOL 178 10/04/2022   Lab Results  Component Value Date   HDL 41 10/04/2022   Lab Results  Component Value Date   LDLCALC 115 (H) 10/04/2022   Lab Results  Component Value Date   TRIG 119 10/04/2022   Lab Results  Component Value Date   CHOLHDL 4.3 10/04/2022   Lab Results  Component Value Date   HGBA1C 5.5 10/04/2022      Assessment & Plan:   Problem List Items Addressed This Visit       Cardiovascular and Mediastinum   Essential hypertension    BP Readings from Last 1 Encounters:  05/17/23 126/72   Well-controlled On lisinopril 2.5 mg QD now Check BMP Counseled for compliance with the medications Advised DASH diet and moderate exercise/walking, at least 150 mins/week       Relevant Medications   lisinopril (ZESTRIL) 2.5 MG tablet     Digestive   GERD    Chronic epigastric pain likely due to GERD/gastritis On Omeprazole 20 mg QD PRN,  refilled Advised to avoid hot and spicy food        Nervous and Auditory   Trigeminal neuralgia of left side of face - Primary    Left-sided facial hypersensitivity, less frequent now Describes it different than migraine Referred to neurology - was told of trigeminal neuralgia, needs MRI, face/trigeminal nerve       Meds ordered this encounter  Medications   lisinopril (ZESTRIL) 2.5 MG tablet    Sig: Take 1 tablet (2.5 mg total) by mouth daily.    Dispense:  90 tablet    Refill:  1    Follow-up: Return in about 6 months (around 11/14/2023) for Annual physical.    Anabel Halon, MD

## 2023-05-17 NOTE — Assessment & Plan Note (Signed)
BP Readings from Last 1 Encounters:  05/17/23 126/72   Well-controlled On lisinopril 2.5 mg QD now Check BMP Counseled for compliance with the medications Advised DASH diet and moderate exercise/walking, at least 150 mins/week

## 2023-05-17 NOTE — Assessment & Plan Note (Signed)
Left-sided facial hypersensitivity, less frequent now Describes it different than migraine Referred to neurology - was told of trigeminal neuralgia, needs MRI, face/trigeminal nerve

## 2023-05-17 NOTE — Assessment & Plan Note (Signed)
 Chronic epigastric pain likely due to GERD/gastritis On Omeprazole 20 mg QD PRN, refilled Advised to avoid hot and spicy food

## 2023-05-17 NOTE — Patient Instructions (Signed)
Please continue to take medications as prescribed.  Please continue to follow DASH diet and perform moderate exercise/walking at least 150 mins/week. 

## 2023-11-15 ENCOUNTER — Encounter: Payer: Self-pay | Admitting: Internal Medicine

## 2023-11-15 ENCOUNTER — Ambulatory Visit (INDEPENDENT_AMBULATORY_CARE_PROVIDER_SITE_OTHER): Payer: Self-pay | Admitting: Internal Medicine

## 2023-11-15 VITALS — BP 136/82 | HR 86 | Ht 62.0 in | Wt 204.0 lb

## 2023-11-15 DIAGNOSIS — I10 Essential (primary) hypertension: Secondary | ICD-10-CM

## 2023-11-15 DIAGNOSIS — E559 Vitamin D deficiency, unspecified: Secondary | ICD-10-CM

## 2023-11-15 DIAGNOSIS — E782 Mixed hyperlipidemia: Secondary | ICD-10-CM

## 2023-11-15 DIAGNOSIS — K219 Gastro-esophageal reflux disease without esophagitis: Secondary | ICD-10-CM

## 2023-11-15 MED ORDER — LISINOPRIL 2.5 MG PO TABS: 2.5000 mg | ORAL_TABLET | Freq: Every day | ORAL | 1 refills | Status: DC

## 2023-11-15 NOTE — Assessment & Plan Note (Signed)
 Chronic epigastric pain likely due to GERD/gastritis On Omeprazole 20 mg QD PRN, refilled Advised to avoid hot and spicy food

## 2023-11-15 NOTE — Progress Notes (Signed)
 Established Patient Office Visit  Subjective:  Patient ID: Matthew Mitchell, male    DOB: 1986-07-31  Age: 38 y.o. MRN: [id number]  CC:  Chief Complaint  Patient presents with   Hypertension    HPI Savien Mamula is a 38 y.o. male with past medical history of HTN, migraine and GERD who presents for f/u of his chronic medical conditions.  HTN: BP is wnl today.  He has been taking lisinopril regularly.  He is also trying to improve his diet.  He reports improvement in headache since starting lisinopril as well.  Denies any chest pain, dyspnea or palpitations.  He had neurology evaluation for left-sided facial pain, and was told of trigeminal neuralgia as well. Since his symptoms are mild and manageable, he decided to avoid medical treatment for now.  He takes omeprazole as needed for epigastric pain. Denies any dysphagia or odynophagia.     Past Medical History:  Diagnosis Date   GERD (gastroesophageal reflux disease)    Hypertension     History reviewed. No pertinent surgical history.  History reviewed. No pertinent family history.  Social History   Socioeconomic History   Marital status: Married    Spouse name: Not on file   Number of children: Not on file   Years of education: Not on file   Highest education level: GED or equivalent  Occupational History   Not on file  Tobacco Use   Smoking status: Never   Smokeless tobacco: Never  Vaping Use   Vaping status: Never Used  Substance and Sexual Activity   Alcohol use: Yes    Alcohol/week: 1.0 standard drink of alcohol    Types: 1 Standard drinks or equivalent per week    Comment: Occ   Drug use: Never   Sexual activity: Yes  Other Topics Concern   Not on file  Social History Narrative   Not on file   Social Drivers of Health   Financial Resource Strain: Low Risk  (05/17/2023)   Overall Financial Resource Strain (CARDIA)    Difficulty of Paying Living Expenses: Not hard at all  Food Insecurity: No Food  Insecurity (05/17/2023)   Hunger Vital Sign    Worried About Running Out of Food in the Last Year: Never true    Ran Out of Food in the Last Year: Never true  Transportation Needs: No Transportation Needs (05/17/2023)   PRAPARE - Administrator, Civil Service (Medical): No    Lack of Transportation (Non-Medical): No  Physical Activity: Insufficiently Active (05/17/2023)   Exercise Vital Sign    Days of Exercise per Week: 2 days    Minutes of Exercise per Session: 30 min  Stress: No Stress Concern Present (05/17/2023)   [employer] of Occupational Health - Occupational Stress Questionnaire    Feeling of Stress : Not at all  Social Connections: Moderately Isolated (05/17/2023)   Social Connection and Isolation Panel [NHANES]    Frequency of Communication with Friends and Family: More than three times a week    Frequency of Social Gatherings with Friends and Family: Three times a week    Attends Religious Services: Never    Active Member of Clubs or Organizations: No    Attends Engineer, structural: Not on file    Marital Status: Married  Catering manager Violence: Not on file    Outpatient Medications Prior to Visit  Medication Sig Dispense Refill   omeprazole (PRILOSEC) 20 MG capsule Take 1 capsule (20 mg total)  by mouth daily. 90 capsule 1   ondansetron (ZOFRAN-ODT) 4 MG disintegrating tablet Take 1 tablet (4 mg total) by mouth every 8 (eight) hours as needed for nausea or vomiting. (Patient not taking: Reported on 10/05/2022) 20 tablet 0   simethicone (MYLICON) 80 MG chewable tablet Chew 80 mg by mouth every 6 (six) hours as needed for flatulence. (Patient not taking: Reported on 10/05/2022)     SUMAtriptan (IMITREX) 50 MG tablet Take 1 tablet (50 mg total) by mouth every 2 (two) hours as needed for migraine. May repeat in 2 hours if headache persists or recurs. (Patient not taking: Reported on 10/05/2022) 10 tablet 1   lisinopril (ZESTRIL) 2.5 MG tablet Take 1 tablet  (2.5 mg total) by mouth daily. 90 tablet 1   No facility-administered medications prior to visit.    No Known Allergies  ROS Review of Systems  Constitutional:  Negative for chills and fever.  HENT:  Negative for congestion and sore throat.   Eyes:  Negative for pain and discharge.  Respiratory:  Negative for cough and shortness of breath.   Cardiovascular:  Negative for chest pain and palpitations.  Gastrointestinal:  Positive for abdominal pain (Mild, epigastric). Negative for diarrhea and vomiting.  Endocrine: Negative for polydipsia and polyuria.  Genitourinary:  Negative for dysuria and hematuria.  Musculoskeletal:  Negative for neck pain and neck stiffness.  Skin:  Negative for rash.  Neurological:  Negative for dizziness, weakness and numbness.  Psychiatric/Behavioral:  Negative for agitation and behavioral problems.       Objective:    Physical Exam Vitals reviewed.  Constitutional:      General: He is not in acute distress.    Appearance: He is not diaphoretic.  HENT:     Head: Normocephalic and atraumatic.     Nose: Nose normal.     Mouth/Throat:     Mouth: Mucous membranes are moist.  Eyes:     General: No scleral icterus.    Extraocular Movements: Extraocular movements intact.  Cardiovascular:     Rate and Rhythm: Normal rate and regular rhythm.     Pulses: Normal pulses.     Heart sounds: Normal heart sounds. No murmur heard. Pulmonary:     Breath sounds: Normal breath sounds. No wheezing or rales.  Abdominal:     Palpations: Abdomen is soft.     Tenderness: There is no abdominal tenderness.  Musculoskeletal:     Cervical back: Neck supple. No tenderness.     Right lower leg: No edema.     Left lower leg: No edema.  Skin:    General: Skin is warm.     Findings: No rash.  Neurological:     General: No focal deficit present.     Mental Status: He is alert and oriented to person, place, and time.     Sensory: No sensory deficit.     Motor: No  weakness.  Psychiatric:        Mood and Affect: Mood normal.        Behavior: Behavior normal.     BP 136/82 (BP Location: Left Arm)   Pulse 86   Ht 5\' 2"  (1.575 m)   Wt 204 lb (92.5 kg)   SpO2 95%   BMI 37.31 kg/m  Wt Readings from Last 3 Encounters:  11/15/23 204 lb (92.5 kg)  05/17/23 199 lb 6.4 oz (90.4 kg)  04/05/23 196 lb 9.6 oz (89.2 kg)    Lab Results  Component Value Date  TSH 2.290 10/04/2022   Lab Results  Component Value Date   WBC 8.4 10/04/2022   HGB 15.5 10/04/2022   HCT 44.6 10/04/2022   MCV 86 10/04/2022   PLT 263 10/04/2022   Lab Results  Component Value Date   NA 139 05/17/2023   K 4.1 05/17/2023   CO2 22 05/17/2023   GLUCOSE 87 05/17/2023   BUN 15 05/17/2023   CREATININE 1.05 05/17/2023   BILITOT 0.4 10/04/2022   ALKPHOS 101 10/04/2022   AST 25 10/04/2022   ALT 40 10/04/2022   PROT 7.2 10/04/2022   ALBUMIN 4.6 10/04/2022   CALCIUM 9.4 05/17/2023   EGFR 94 05/17/2023   Lab Results  Component Value Date   CHOL 178 10/04/2022   Lab Results  Component Value Date   HDL 41 10/04/2022   Lab Results  Component Value Date   LDLCALC 115 (H) 10/04/2022   Lab Results  Component Value Date   TRIG 119 10/04/2022   Lab Results  Component Value Date   CHOLHDL 4.3 10/04/2022   Lab Results  Component Value Date   HGBA1C 5.5 10/04/2022      Assessment & Plan:   Problem List Items Addressed This Visit       Cardiovascular and Mediastinum   Essential hypertension - Primary   BP Readings from Last 1 Encounters:  11/15/23 136/82   Well-controlled with lisinopril 2.5 mg QD now Counseled for compliance with the medications Advised DASH diet and moderate exercise/walking, at least 150 mins/week      Relevant Medications   lisinopril (ZESTRIL) 2.5 MG tablet   Other Relevant Orders   TSH   CMP14+EGFR   CBC with Differential/Platelet     Digestive   GERD   Chronic epigastric pain likely due to GERD/gastritis On Omeprazole 20  mg QD PRN, refilled Advised to avoid hot and spicy food        Other   Mixed hyperlipidemia   Check lipid profile Advised to follow DASH diet for now      Relevant Medications   lisinopril (ZESTRIL) 2.5 MG tablet   Other Relevant Orders   Lipid panel   Vitamin D deficiency   Relevant Orders   VITAMIN D 25 Hydroxy (Vit-D Deficiency, Fractures)    Meds ordered this encounter  Medications   lisinopril (ZESTRIL) 2.5 MG tablet    Sig: Take 1 tablet (2.5 mg total) by mouth daily.    Dispense:  90 tablet    Refill:  1    Follow-up: Return in about 6 months (around 05/17/2024) for HTN.    Anabel Halon, MD

## 2023-11-15 NOTE — Assessment & Plan Note (Addendum)
Check lipid profile Advised to follow DASH diet for now

## 2023-11-15 NOTE — Patient Instructions (Signed)
 Please continue to take medications as prescribed.  Please continue to follow low salt diet and perform moderate exercise/walking at least 150 mins/week.  Please get fasting blood tests done before the next visit.

## 2023-11-15 NOTE — Assessment & Plan Note (Signed)
 BP Readings from Last 1 Encounters:  11/15/23 136/82   Well-controlled with lisinopril 2.5 mg QD now Counseled for compliance with the medications Advised DASH diet and moderate exercise/walking, at least 150 mins/week

## 2023-11-15 NOTE — Assessment & Plan Note (Signed)
Physical exam as documented. Fasting blood test reviewed. Diet modification and moderate exercise advised.

## 2024-04-01 ENCOUNTER — Other Ambulatory Visit: Payer: Self-pay | Admitting: Internal Medicine

## 2024-04-01 ENCOUNTER — Ambulatory Visit: Payer: Self-pay | Admitting: Internal Medicine

## 2024-04-01 VITALS — BP 133/86 | HR 82 | Ht 62.0 in | Wt 199.6 lb

## 2024-04-01 DIAGNOSIS — I10 Essential (primary) hypertension: Secondary | ICD-10-CM

## 2024-04-01 DIAGNOSIS — K219 Gastro-esophageal reflux disease without esophagitis: Secondary | ICD-10-CM

## 2024-04-01 DIAGNOSIS — R140 Abdominal distension (gaseous): Secondary | ICD-10-CM

## 2024-04-01 MED ORDER — OMEPRAZOLE 40 MG PO CPDR: 40.0000 mg | DELAYED_RELEASE_CAPSULE | Freq: Every day | ORAL | 1 refills | Status: AC

## 2024-04-01 NOTE — Progress Notes (Unsigned)
 Acute Office Visit  Subjective:    Patient ID: Matthew Mitchell, male    DOB: May 22, 1986, 38 y.o.   MRN: [id number]  Chief Complaint  Patient presents with   Abdominal Pain    Pt reports sx of gastrointestinal that started over the weekend, pain started on left epigastric area and has worsened, has some burning like acid reflux feeling.    HPI Patient is in today for complaint of left epigastric area for the last weekend, when he was traveling.  He reports that he had food with excess amount of sauce in it.  He tried taking omeprazole  with mild relief.  He had bloating at that time as well.  He had abdominal cramps, but have resolved now.  He attributes his symptoms to the food he consumed while traveling.  Denies any nausea or vomiting.  Denies diarrhea, melena or hematochezia.  Past Medical History:  Diagnosis Date   GERD (gastroesophageal reflux disease)    Hypertension     History reviewed. No pertinent surgical history.  History reviewed. No pertinent family history.  Social History   Socioeconomic History   Marital status: Married    Spouse name: Not on file   Number of children: Not on file   Years of education: Not on file   Highest education level: GED or equivalent  Occupational History   Not on file  Tobacco Use   Smoking status: Never   Smokeless tobacco: Never  Vaping Use   Vaping status: Never Used  Substance and Sexual Activity   Alcohol use: Yes    Alcohol/week: 1.0 standard drink of alcohol    Types: 1 Standard drinks or equivalent per week    Comment: Occ   Drug use: Never   Sexual activity: Yes  Other Topics Concern   Not on file  Social History Narrative   Not on file   Social Drivers of Health   Financial Resource Strain: Low Risk  (04/01/2024)   Overall Financial Resource Strain (CARDIA)    Difficulty of Paying Living Expenses: Not hard at all  Food Insecurity: No Food Insecurity (04/01/2024)   Hunger Vital Sign    Worried About Running  Out of Food in the Last Year: Never true    Ran Out of Food in the Last Year: Never true  Transportation Needs: No Transportation Needs (04/01/2024)   PRAPARE - Administrator, Civil Service (Medical): No    Lack of Transportation (Non-Medical): No  Physical Activity: Insufficiently Active (04/01/2024)   Exercise Vital Sign    Days of Exercise per Week: 1 day    Minutes of Exercise per Session: 30 min  Stress: No Stress Concern Present (04/01/2024)   [employer] of Occupational Health - Occupational Stress Questionnaire    Feeling of Stress: Not at all  Social Connections: Moderately Integrated (04/01/2024)   Social Connection and Isolation Panel    Frequency of Communication with Friends and Family: Twice a week    Frequency of Social Gatherings with Friends and Family: Twice a week    Attends Religious Services: 1 to 4 times per year    Active Member of [employer] or Organizations: No    Attends Engineer, structural: Not on file    Marital Status: Married  Catering manager Violence: Not on file    Outpatient Medications Prior to Visit  Medication Sig Dispense Refill   lisinopril  (ZESTRIL ) 2.5 MG tablet TAKE 1 TABLET(2.5 MG) BY MOUTH DAILY 90 tablet 1  ondansetron  (ZOFRAN -ODT) 4 MG disintegrating tablet Take 1 tablet (4 mg total) by mouth every 8 (eight) hours as needed for nausea or vomiting. 20 tablet 0   simethicone (MYLICON) 80 MG chewable tablet Chew 80 mg by mouth every 6 (six) hours as needed for flatulence.     SUMAtriptan  (IMITREX ) 50 MG tablet Take 1 tablet (50 mg total) by mouth every 2 (two) hours as needed for migraine. May repeat in 2 hours if headache persists or recurs. 10 tablet 1   omeprazole  (PRILOSEC) 20 MG capsule Take 1 capsule (20 mg total) by mouth daily. 90 capsule 1   No facility-administered medications prior to visit.    No Known Allergies  Review of Systems  Constitutional:  Negative for chills and fever.  HENT:  Negative for  congestion and sore throat.   Eyes:  Negative for pain and discharge.  Respiratory:  Negative for cough and shortness of breath.   Cardiovascular:  Negative for chest pain and palpitations.  Gastrointestinal:  Positive for abdominal pain (Mild, epigastric). Negative for diarrhea and vomiting.  Endocrine: Negative for polydipsia and polyuria.  Genitourinary:  Negative for dysuria and hematuria.  Musculoskeletal:  Negative for neck pain and neck stiffness.  Skin:  Negative for rash.  Neurological:  Negative for dizziness, weakness and numbness.  Psychiatric/Behavioral:  Negative for agitation and behavioral problems.        Objective:    Physical Exam Vitals reviewed.  Constitutional:      General: He is not in acute distress.    Appearance: He is not diaphoretic.  HENT:     Head: Normocephalic and atraumatic.     Nose: Nose normal.     Mouth/Throat:     Mouth: Mucous membranes are moist.  Eyes:     General: No scleral icterus.    Extraocular Movements: Extraocular movements intact.  Cardiovascular:     Rate and Rhythm: Normal rate and regular rhythm.     Pulses: Normal pulses.     Heart sounds: Normal heart sounds. No murmur heard. Pulmonary:     Breath sounds: Normal breath sounds. No wheezing or rales.  Abdominal:     Palpations: Abdomen is soft.     Tenderness: There is abdominal tenderness (Mild, epigastric). There is no guarding or rebound.  Musculoskeletal:     Cervical back: Neck supple. No tenderness.     Right lower leg: No edema.     Left lower leg: No edema.  Skin:    General: Skin is warm.     Findings: No rash.  Neurological:     General: No focal deficit present.     Mental Status: He is alert and oriented to person, place, and time.  Psychiatric:        Mood and Affect: Mood normal.        Behavior: Behavior normal.     BP 133/86   Pulse 82   Ht 5' 2 (1.575 m)   Wt 199 lb 9.6 oz (90.5 kg)   SpO2 98%   BMI 36.51 kg/m  Wt Readings from Last 3  Encounters:  04/01/24 199 lb 9.6 oz (90.5 kg)  11/15/23 204 lb (92.5 kg)  05/17/23 199 lb 6.4 oz (90.4 kg)        Assessment & Plan:   Problem List Items Addressed This Visit       Digestive   GERD - Primary   Chronic epigastric pain likely due to GERD/gastritis Recent worsening of epigastric pain, likely  due to food intake with excess sauce Increased dose of omeprazole  40 mg QD PRN Advised to avoid hot and spicy food If persistent, will need H. pylori testing and GI evaluation      Relevant Medications   omeprazole  (PRILOSEC) 40 MG capsule     Other   Bloating   Advised to avoid hot and spicy food Simethicone as needed for bloating        Meds ordered this encounter  Medications   omeprazole  (PRILOSEC) 40 MG capsule    Sig: Take 1 capsule (40 mg total) by mouth daily.    Dispense:  90 capsule    Refill:  1     Moataz Tavis MARLA Blanch, MD

## 2024-04-01 NOTE — Patient Instructions (Signed)
 Please take Omeprazole  40 mg for acid reflux.  Please avoid hot and spicy food.

## 2024-04-04 ENCOUNTER — Encounter: Payer: Self-pay | Admitting: Internal Medicine

## 2024-04-04 DIAGNOSIS — R140 Abdominal distension (gaseous): Secondary | ICD-10-CM | POA: Insufficient documentation

## 2024-04-04 NOTE — Assessment & Plan Note (Signed)
 Chronic epigastric pain likely due to GERD/gastritis Recent worsening of epigastric pain, likely due to food intake with excess sauce Increased dose of omeprazole  40 mg QD PRN Advised to avoid hot and spicy food If persistent, will need H. pylori testing and GI evaluation

## 2024-04-04 NOTE — Assessment & Plan Note (Signed)
 Advised to avoid hot and spicy food Simethicone as needed for bloating

## 2024-05-14 LAB — CBC WITH DIFFERENTIAL/PLATELET
Basophils Absolute: 0 x10E3/uL (ref 0.0–0.2)
Basos: 0 %
EOS (ABSOLUTE): 0.2 x10E3/uL (ref 0.0–0.4)
Eos: 3 %
Hematocrit: 46.1 % (ref 37.5–51.0)
Hemoglobin: 15.2 g/dL (ref 13.0–17.7)
Immature Grans (Abs): 0 x10E3/uL (ref 0.0–0.1)
Immature Granulocytes: 0 %
Lymphocytes Absolute: 2.7 x10E3/uL (ref 0.7–3.1)
Lymphs: 38 %
MCH: 29.6 pg (ref 26.6–33.0)
MCHC: 33 g/dL (ref 31.5–35.7)
MCV: 90 fL (ref 79–97)
Monocytes Absolute: 0.4 x10E3/uL (ref 0.1–0.9)
Monocytes: 5 %
Neutrophils Absolute: 3.8 x10E3/uL (ref 1.4–7.0)
Neutrophils: 54 %
Platelets: 253 x10E3/uL (ref 150–450)
RBC: 5.14 x10E6/uL (ref 4.14–5.80)
RDW: 13.1 % (ref 11.6–15.4)
WBC: 7.2 x10E3/uL (ref 3.4–10.8)

## 2024-05-14 LAB — CMP14+EGFR
ALT: 24 IU/L (ref 0–44)
AST: 18 IU/L (ref 0–40)
Albumin: 4.6 g/dL (ref 4.1–5.1)
Alkaline Phosphatase: 88 IU/L (ref 44–121)
BUN/Creatinine Ratio: 14 (ref 9–20)
BUN: 12 mg/dL (ref 6–20)
Bilirubin Total: 0.3 mg/dL (ref 0.0–1.2)
CO2: 20 mmol/L (ref 20–29)
Calcium: 9.5 mg/dL (ref 8.7–10.2)
Chloride: 103 mmol/L (ref 96–106)
Creatinine, Ser: 0.87 mg/dL (ref 0.76–1.27)
Globulin, Total: 2.6 g/dL (ref 1.5–4.5)
Glucose: 92 mg/dL (ref 70–99)
Potassium: 4 mmol/L (ref 3.5–5.2)
Sodium: 139 mmol/L (ref 134–144)
Total Protein: 7.2 g/dL (ref 6.0–8.5)
eGFR: 113 mL/min/1.73 (ref >59)

## 2024-05-14 LAB — LIPID PANEL
Chol/HDL Ratio: 5 ratio (ref 0.0–5.0)
Cholesterol, Total: 213 mg/dL — ABNORMAL HIGH (ref 100–199)
HDL: 43 mg/dL (ref >39)
LDL Chol Calc (NIH): 132 mg/dL — ABNORMAL HIGH (ref 0–99)
Triglycerides: 210 mg/dL — ABNORMAL HIGH (ref 0–149)
VLDL Cholesterol Cal: 38 mg/dL (ref 5–40)

## 2024-05-14 LAB — VITAMIN D 25 HYDROXY (VIT D DEFICIENCY, FRACTURES): Vit D, 25-Hydroxy: 24.1 ng/mL — ABNORMAL LOW (ref 30.0–100.0)

## 2024-05-14 LAB — TSH: TSH: 2.37 u[IU]/mL (ref 0.450–4.500)

## 2024-05-20 ENCOUNTER — Ambulatory Visit: Payer: Self-pay | Admitting: Internal Medicine

## 2024-05-28 ENCOUNTER — Encounter: Payer: Self-pay | Admitting: Internal Medicine

## 2024-05-28 ENCOUNTER — Ambulatory Visit: Payer: Self-pay | Admitting: Nurse Practitioner

## 2024-06-02 ENCOUNTER — Encounter: Payer: Self-pay | Admitting: Internal Medicine

## 2024-06-02 ENCOUNTER — Ambulatory Visit (INDEPENDENT_AMBULATORY_CARE_PROVIDER_SITE_OTHER): Payer: Self-pay | Admitting: Internal Medicine

## 2024-06-02 VITALS — BP 134/86 | HR 86 | Ht 62.0 in | Wt 195.8 lb

## 2024-06-02 DIAGNOSIS — K219 Gastro-esophageal reflux disease without esophagitis: Secondary | ICD-10-CM | POA: Diagnosis not present

## 2024-06-02 DIAGNOSIS — E782 Mixed hyperlipidemia: Secondary | ICD-10-CM

## 2024-06-02 DIAGNOSIS — I10 Essential (primary) hypertension: Secondary | ICD-10-CM

## 2024-06-02 DIAGNOSIS — Z0001 Encounter for general adult medical examination with abnormal findings: Secondary | ICD-10-CM | POA: Diagnosis not present

## 2024-06-02 DIAGNOSIS — Z2821 Immunization not carried out because of patient refusal: Secondary | ICD-10-CM

## 2024-06-02 DIAGNOSIS — E6609 Other obesity due to excess calories: Secondary | ICD-10-CM

## 2024-06-02 DIAGNOSIS — E66812 Obesity, class 2: Secondary | ICD-10-CM

## 2024-06-02 NOTE — Progress Notes (Unsigned)
 Established Patient Office Visit  Subjective:  Patient ID: Matthew Mitchell, male    DOB: 03-27-1986  Age: 38 y.o. MRN: [id number]  CC:  Chief Complaint  Patient presents with   Hyperlipidemia    Has concerns about his blood work.     HPI Matthew Mitchell is a 38 y.o. male with past medical history of HTN, migraine and GERD who presents for f/u of his chronic medical conditions.  HTN: BP is wnl today.  He has been taking lisinopril  regularly.  He is also trying to improve his diet.  He reports improvement in headache since starting lisinopril  as well.  Denies any chest pain, dyspnea or palpitations.  He had neurology evaluation for left-sided facial pain, and was told of trigeminal neuralgia as well. Since his symptoms are mild and manageable, he decided to avoid medical treatment for now.  He takes omeprazole  as needed for epigastric pain. Denies any dysphagia or odynophagia.  HLD: His recent blood tests showed elevated LDL - 132.   Past Medical History:  Diagnosis Date   GERD (gastroesophageal reflux disease)    Hypertension     History reviewed. No pertinent surgical history.  History reviewed. No pertinent family history.  Social History   Socioeconomic History   Marital status: Married    Spouse name: Not on file   Number of children: Not on file   Years of education: Not on file   Highest education level: GED or equivalent  Occupational History   Not on file  Tobacco Use   Smoking status: Never   Smokeless tobacco: Never  Vaping Use   Vaping status: Never Used  Substance and Sexual Activity   Alcohol use: Yes    Alcohol/week: 1.0 standard drink of alcohol    Types: 1 Standard drinks or equivalent per week    Comment: Occ   Drug use: Never   Sexual activity: Yes  Other Topics Concern   Not on file  Social History Narrative   Not on file   Social Drivers of Health   Financial Resource Strain: Low Risk  (04/01/2024)   Overall Financial Resource Strain  (CARDIA)    Difficulty of Paying Living Expenses: Not hard at all  Food Insecurity: No Food Insecurity (04/01/2024)   Hunger Vital Sign    Worried About Running Out of Food in the Last Year: Never true    Ran Out of Food in the Last Year: Never true  Transportation Needs: No Transportation Needs (04/01/2024)   PRAPARE - Administrator, Civil Service (Medical): No    Lack of Transportation (Non-Medical): No  Physical Activity: Insufficiently Active (04/01/2024)   Exercise Vital Sign    Days of Exercise per Week: 1 day    Minutes of Exercise per Session: 30 min  Stress: No Stress Concern Present (04/01/2024)   [employer] of Occupational Health - Occupational Stress Questionnaire    Feeling of Stress: Not at all  Social Connections: Moderately Integrated (04/01/2024)   Social Connection and Isolation Panel    Frequency of Communication with Friends and Family: Twice a week    Frequency of Social Gatherings with Friends and Family: Twice a week    Attends Religious Services: 1 to 4 times per year    Active Member of [employer] or Organizations: No    Attends Engineer, structural: Not on file    Marital Status: Married  Catering manager Violence: Not on file    Outpatient Medications Prior to Visit  Medication Sig Dispense Refill   lisinopril  (ZESTRIL ) 2.5 MG tablet TAKE 1 TABLET(2.5 MG) BY MOUTH DAILY 90 tablet 1   omeprazole  (PRILOSEC) 40 MG capsule Take 1 capsule (40 mg total) by mouth daily. 90 capsule 1   ondansetron  (ZOFRAN -ODT) 4 MG disintegrating tablet Take 1 tablet (4 mg total) by mouth every 8 (eight) hours as needed for nausea or vomiting. 20 tablet 0   simethicone (MYLICON) 80 MG chewable tablet Chew 80 mg by mouth every 6 (six) hours as needed for flatulence.     SUMAtriptan  (IMITREX ) 50 MG tablet Take 1 tablet (50 mg total) by mouth every 2 (two) hours as needed for migraine. May repeat in 2 hours if headache persists or recurs. 10 tablet 1   No  facility-administered medications prior to visit.    No Known Allergies  ROS Review of Systems  Constitutional:  Negative for chills and fever.  HENT:  Negative for congestion and sore throat.   Eyes:  Negative for pain and discharge.  Respiratory:  Negative for cough and shortness of breath.   Cardiovascular:  Negative for chest pain and palpitations.  Gastrointestinal:  Positive for abdominal pain (Mild, epigastric). Negative for diarrhea and vomiting.  Endocrine: Negative for polydipsia and polyuria.  Genitourinary:  Negative for dysuria and hematuria.  Musculoskeletal:  Negative for neck pain and neck stiffness.  Skin:  Negative for rash.  Neurological:  Negative for dizziness, weakness and numbness.  Psychiatric/Behavioral:  Negative for agitation and behavioral problems.       Objective:    Physical Exam Vitals reviewed.  Constitutional:      General: He is not in acute distress.    Appearance: He is not diaphoretic.  HENT:     Head: Normocephalic and atraumatic.     Nose: Nose normal.     Mouth/Throat:     Mouth: Mucous membranes are moist.  Eyes:     General: No scleral icterus.    Extraocular Movements: Extraocular movements intact.  Cardiovascular:     Rate and Rhythm: Normal rate and regular rhythm.     Heart sounds: Normal heart sounds. No murmur heard. Pulmonary:     Breath sounds: Normal breath sounds. No wheezing or rales.  Abdominal:     Palpations: Abdomen is soft.     Tenderness: There is no abdominal tenderness.  Musculoskeletal:     Cervical back: Neck supple. No tenderness.     Right lower leg: No edema.     Left lower leg: No edema.  Skin:    General: Skin is warm.     Findings: No rash.  Neurological:     General: No focal deficit present.     Mental Status: He is alert and oriented to person, place, and time.     Sensory: No sensory deficit.     Motor: No weakness.  Psychiatric:        Mood and Affect: Mood normal.        Behavior:  Behavior normal.     BP 134/86   Pulse 86   Ht 5' 2 (1.575 m)   Wt 195 lb 12.8 oz (88.8 kg)   SpO2 100%   BMI 35.81 kg/m  Wt Readings from Last 3 Encounters:  06/02/24 195 lb 12.8 oz (88.8 kg)  04/01/24 199 lb 9.6 oz (90.5 kg)  11/15/23 204 lb (92.5 kg)    Lab Results  Component Value Date   TSH 2.370 05/13/2024   Lab Results  Component Value  Date   WBC 7.2 05/13/2024   HGB 15.2 05/13/2024   HCT 46.1 05/13/2024   MCV 90 05/13/2024   PLT 253 05/13/2024   Lab Results  Component Value Date   NA 139 05/13/2024   K 4.0 05/13/2024   CO2 20 05/13/2024   GLUCOSE 92 05/13/2024   BUN 12 05/13/2024   CREATININE 0.87 05/13/2024   BILITOT 0.3 05/13/2024   ALKPHOS 88 05/13/2024   AST 18 05/13/2024   ALT 24 05/13/2024   PROT 7.2 05/13/2024   ALBUMIN 4.6 05/13/2024   CALCIUM 9.5 05/13/2024   EGFR 113 05/13/2024   Lab Results  Component Value Date   CHOL 213 (H) 05/13/2024   Lab Results  Component Value Date   HDL 43 05/13/2024   Lab Results  Component Value Date   LDLCALC 132 (H) 05/13/2024   Lab Results  Component Value Date   TRIG 210 (H) 05/13/2024   Lab Results  Component Value Date   CHOLHDL 5.0 05/13/2024   Lab Results  Component Value Date   HGBA1C 5.5 10/04/2022      Assessment & Plan:   Problem List Items Addressed This Visit       Cardiovascular and Mediastinum   Essential hypertension   BP Readings from Last 1 Encounters:  06/02/24 134/86   Well-controlled with lisinopril  2.5 mg QD now Counseled for compliance with the medications Advised DASH diet and moderate exercise/walking, at least 150 mins/week      Relevant Orders   CMP14+EGFR     Digestive   GERD   Chronic epigastric pain likely due to GERD/gastritis Recent worsening of epigastric pain, likely due to food intake with excess sauce Now better with omeprazole  Advised to avoid hot and spicy food        Other   Mixed hyperlipidemia - Primary   Checked lipid profile -  LDL 132 now, was 169 in 2022, had improved with diet earlier After discussion of medication, he prefers to focus on diet and exercise for now Advised to follow DASH diet for now      Relevant Orders   CMP14+EGFR   Lipid Profile    No orders of the defined types were placed in this encounter.   Follow-up: Return in about 6 months (around 11/30/2024) for HTN and HLD.    Suzzane MARLA Blanch, MD

## 2024-06-02 NOTE — Patient Instructions (Addendum)
 Please start taking Vitamin D  2000 IU once daily.  Please continue to take medications as prescribed.  Please continue to follow DASH diet and perform moderate exercise/walking at least 150 mins/week.  Please get fasting blood tests done before the next visit.

## 2024-06-02 NOTE — Assessment & Plan Note (Addendum)
 Checked lipid profile - LDL 132 now, was 169 in 2022, had improved with diet earlier After discussion of medication, he prefers to focus on diet and exercise for now Advised to follow DASH diet for now

## 2024-06-02 NOTE — Assessment & Plan Note (Signed)
 Chronic epigastric pain likely due to GERD/gastritis Recent worsening of epigastric pain, likely due to food intake with excess sauce Now better with omeprazole  Advised to avoid hot and spicy food

## 2024-06-02 NOTE — Assessment & Plan Note (Signed)
 BP Readings from Last 1 Encounters:  06/02/24 134/86   Well-controlled with lisinopril  2.5 mg QD now Counseled for compliance with the medications Advised DASH diet and moderate exercise/walking, at least 150 mins/week

## 2024-06-03 DIAGNOSIS — E66812 Obesity, class 2: Secondary | ICD-10-CM | POA: Insufficient documentation

## 2024-06-03 NOTE — Assessment & Plan Note (Signed)
 BMI Readings from Last 3 Encounters:  06/02/24 35.81 kg/m  04/01/24 36.51 kg/m  11/15/23 37.31 kg/m   Advised to follow DASH diet and perform moderate exercise/walking at least 150 minutes/week

## 2024-06-03 NOTE — Assessment & Plan Note (Signed)
Physical exam as documented. Fasting blood test reviewed. Diet modification and moderate exercise advised.

## 2024-09-25 ENCOUNTER — Other Ambulatory Visit: Payer: Self-pay | Admitting: Internal Medicine

## 2024-09-25 DIAGNOSIS — I10 Essential (primary) hypertension: Secondary | ICD-10-CM

## 2024-12-01 ENCOUNTER — Ambulatory Visit: Payer: Self-pay | Admitting: Internal Medicine
